# Patient Record
Sex: Female | Born: 1955 | Race: White | Hispanic: No | Marital: Married | Smoking: Never smoker
Health system: Southern US, Community
[De-identification: ages and names within clinical notes are randomized; demographics above are authoritative.]

## PROBLEM LIST (undated history)

## (undated) DIAGNOSIS — R112 Nausea with vomiting, unspecified: Secondary | ICD-10-CM

## (undated) DIAGNOSIS — G4700 Insomnia, unspecified: Secondary | ICD-10-CM

## (undated) DIAGNOSIS — Z9889 Other specified postprocedural states: Secondary | ICD-10-CM

## (undated) HISTORY — PX: COLONOSCOPY: SHX174

---
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
## 1998-07-23 ENCOUNTER — Encounter: Payer: Self-pay | Admitting: Gynecology

## 1998-07-23 ENCOUNTER — Ambulatory Visit: Admission: RE | Admit: 1998-07-23 | Discharge: 1998-07-23 | Payer: Self-pay | Admitting: Gynecology

## 1999-06-07 ENCOUNTER — Ambulatory Visit (HOSPITAL_COMMUNITY): Admission: RE | Admit: 1999-06-07 | Discharge: 1999-06-07 | Payer: Self-pay | Admitting: Gynecology

## 1999-06-07 ENCOUNTER — Encounter: Payer: Self-pay | Admitting: Gynecology

## 1999-10-29 ENCOUNTER — Other Ambulatory Visit: Admission: RE | Admit: 1999-10-29 | Discharge: 1999-10-29 | Payer: Self-pay | Admitting: Gynecology

## 2000-08-22 ENCOUNTER — Ambulatory Visit (HOSPITAL_COMMUNITY): Admission: RE | Admit: 2000-08-22 | Discharge: 2000-08-22 | Payer: Self-pay | Admitting: Gynecology

## 2000-08-22 ENCOUNTER — Encounter: Payer: Self-pay | Admitting: Gynecology

## 2000-11-27 ENCOUNTER — Other Ambulatory Visit: Admission: RE | Admit: 2000-11-27 | Discharge: 2000-11-27 | Payer: Self-pay | Admitting: Gynecology

## 2001-12-18 ENCOUNTER — Ambulatory Visit (HOSPITAL_COMMUNITY): Admission: RE | Admit: 2001-12-18 | Discharge: 2001-12-18 | Payer: Self-pay | Admitting: Gynecology

## 2001-12-18 ENCOUNTER — Encounter: Payer: Self-pay | Admitting: Gynecology

## 2002-02-19 ENCOUNTER — Other Ambulatory Visit: Admission: RE | Admit: 2002-02-19 | Discharge: 2002-02-19 | Payer: Self-pay | Admitting: Gynecology

## 2003-03-31 ENCOUNTER — Other Ambulatory Visit: Admission: RE | Admit: 2003-03-31 | Discharge: 2003-03-31 | Payer: Self-pay | Admitting: Gynecology

## 2004-11-04 ENCOUNTER — Ambulatory Visit (HOSPITAL_COMMUNITY): Admission: RE | Admit: 2004-11-04 | Discharge: 2004-11-04 | Payer: Self-pay | Admitting: Gynecology

## 2005-03-23 ENCOUNTER — Other Ambulatory Visit: Admission: RE | Admit: 2005-03-23 | Discharge: 2005-03-23 | Payer: Self-pay | Admitting: Gynecology

## 2006-06-05 ENCOUNTER — Ambulatory Visit (HOSPITAL_COMMUNITY): Admission: RE | Admit: 2006-06-05 | Discharge: 2006-06-05 | Payer: Self-pay | Admitting: Gynecology

## 2006-06-15 ENCOUNTER — Other Ambulatory Visit: Admission: RE | Admit: 2006-06-15 | Discharge: 2006-06-15 | Payer: Self-pay | Admitting: Gynecology

## 2007-06-07 ENCOUNTER — Ambulatory Visit (HOSPITAL_COMMUNITY): Admission: RE | Admit: 2007-06-07 | Discharge: 2007-06-07 | Payer: Self-pay | Admitting: Gynecology

## 2007-06-13 ENCOUNTER — Encounter: Admission: RE | Admit: 2007-06-13 | Discharge: 2007-06-13 | Payer: Self-pay | Admitting: Gynecology

## 2007-07-03 ENCOUNTER — Other Ambulatory Visit: Admission: RE | Admit: 2007-07-03 | Discharge: 2007-07-03 | Payer: Self-pay | Admitting: Gynecology

## 2008-06-13 ENCOUNTER — Ambulatory Visit (HOSPITAL_COMMUNITY): Admission: RE | Admit: 2008-06-13 | Discharge: 2008-06-13 | Payer: Self-pay | Admitting: Gynecology

## 2008-07-03 ENCOUNTER — Encounter: Payer: Self-pay | Admitting: Women's Health

## 2008-07-03 ENCOUNTER — Other Ambulatory Visit: Admission: RE | Admit: 2008-07-03 | Discharge: 2008-07-03 | Payer: Self-pay | Admitting: Gynecology

## 2008-07-03 ENCOUNTER — Ambulatory Visit: Payer: Self-pay | Admitting: Women's Health

## 2010-04-23 ENCOUNTER — Ambulatory Visit (HOSPITAL_COMMUNITY): Admission: RE | Admit: 2010-04-23 | Discharge: 2010-04-23 | Payer: Self-pay | Admitting: Gynecology

## 2010-04-30 ENCOUNTER — Encounter: Admission: RE | Admit: 2010-04-30 | Discharge: 2010-04-30 | Payer: Self-pay | Admitting: Gynecology

## 2010-10-03 ENCOUNTER — Encounter: Payer: Self-pay | Admitting: Gynecology

## 2011-04-13 ENCOUNTER — Other Ambulatory Visit: Payer: Self-pay | Admitting: Gynecology

## 2011-04-13 DIAGNOSIS — Z1231 Encounter for screening mammogram for malignant neoplasm of breast: Secondary | ICD-10-CM

## 2011-04-26 ENCOUNTER — Ambulatory Visit (HOSPITAL_COMMUNITY): Payer: Self-pay

## 2011-04-27 ENCOUNTER — Encounter: Payer: Self-pay | Admitting: Gynecology

## 2011-10-26 ENCOUNTER — Ambulatory Visit: Payer: 59 | Attending: Orthopedic Surgery | Admitting: Physical Therapy

## 2011-10-26 DIAGNOSIS — IMO0001 Reserved for inherently not codable concepts without codable children: Secondary | ICD-10-CM | POA: Insufficient documentation

## 2011-10-26 DIAGNOSIS — M25619 Stiffness of unspecified shoulder, not elsewhere classified: Secondary | ICD-10-CM | POA: Insufficient documentation

## 2011-10-26 DIAGNOSIS — M7500 Adhesive capsulitis of unspecified shoulder: Secondary | ICD-10-CM | POA: Insufficient documentation

## 2011-10-31 ENCOUNTER — Ambulatory Visit: Payer: 59 | Admitting: Rehabilitative and Restorative Service Providers"

## 2011-11-07 ENCOUNTER — Ambulatory Visit: Payer: 59 | Admitting: Rehabilitative and Restorative Service Providers"

## 2011-11-09 ENCOUNTER — Ambulatory Visit: Payer: 59 | Admitting: Physical Therapy

## 2011-11-14 ENCOUNTER — Ambulatory Visit: Payer: 59 | Attending: Orthopedic Surgery | Admitting: Rehabilitative and Restorative Service Providers"

## 2011-11-14 DIAGNOSIS — IMO0001 Reserved for inherently not codable concepts without codable children: Secondary | ICD-10-CM | POA: Insufficient documentation

## 2011-11-14 DIAGNOSIS — M25619 Stiffness of unspecified shoulder, not elsewhere classified: Secondary | ICD-10-CM | POA: Insufficient documentation

## 2011-11-17 ENCOUNTER — Ambulatory Visit: Payer: 59 | Admitting: Rehabilitative and Restorative Service Providers"

## 2011-11-21 ENCOUNTER — Ambulatory Visit: Payer: 59 | Admitting: Rehabilitation

## 2011-11-24 ENCOUNTER — Ambulatory Visit: Payer: 59 | Admitting: Rehabilitative and Restorative Service Providers"

## 2011-11-28 ENCOUNTER — Ambulatory Visit: Payer: 59 | Admitting: Rehabilitative and Restorative Service Providers"

## 2011-12-01 ENCOUNTER — Encounter: Payer: Commercial Managed Care - PPO | Admitting: Rehabilitative and Restorative Service Providers"

## 2011-12-13 ENCOUNTER — Encounter: Payer: Commercial Managed Care - PPO | Admitting: Rehabilitative and Restorative Service Providers"

## 2011-12-15 ENCOUNTER — Encounter: Payer: Commercial Managed Care - PPO | Admitting: Rehabilitative and Restorative Service Providers"

## 2011-12-16 ENCOUNTER — Ambulatory Visit (HOSPITAL_COMMUNITY)
Admission: RE | Admit: 2011-12-16 | Discharge: 2011-12-16 | Disposition: A | Discharge status: Home / Self Care | Payer: 59 | Source: Ambulatory Visit | Attending: Gynecology | Admitting: Gynecology

## 2011-12-16 DIAGNOSIS — Z1231 Encounter for screening mammogram for malignant neoplasm of breast: Secondary | ICD-10-CM | POA: Insufficient documentation

## 2011-12-19 ENCOUNTER — Encounter: Payer: Self-pay | Admitting: Women's Health

## 2011-12-19 ENCOUNTER — Other Ambulatory Visit (HOSPITAL_COMMUNITY)
Admission: RE | Admit: 2011-12-19 | Discharge: 2011-12-19 | Disposition: A | Discharge status: Home / Self Care | Payer: 59 | Source: Ambulatory Visit | Attending: Obstetrics and Gynecology | Admitting: Obstetrics and Gynecology

## 2011-12-19 ENCOUNTER — Ambulatory Visit (INDEPENDENT_AMBULATORY_CARE_PROVIDER_SITE_OTHER): Payer: 59 | Admitting: Women's Health

## 2011-12-19 VITALS — BP 94/60 | Ht 63.0 in | Wt 146.0 lb

## 2011-12-19 DIAGNOSIS — Z01419 Encounter for gynecological examination (general) (routine) without abnormal findings: Secondary | ICD-10-CM | POA: Insufficient documentation

## 2011-12-19 NOTE — Progress Notes (Signed)
Teresa Sharp November 16, 1955 [id number]    History:    The patient presents for annual exam.  Monthly 5 days cycles/vasectomy with no complaints. States had labs at work at [employer] with normal blood sugar and cholesterol. History of normal Paps, last one in 2009. Normal mammogram August 2011 and April 2013. Has not had a colonoscopy.   Past medical history, past surgical history, family history and social history were all reviewed and documented in the EPIC chart. 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 2 sons ages 54 and16, doing well.  ROS:  A  ROS was performed and pertinent positives and negatives are included in the history.  Exam:  Filed Vitals:   12/19/11 0810  BP: 94/60    General appearance:  Normal Head/Neck:  Normal, without cervical or supraclavicular adenopathy. Thyroid:  Symmetrical, normal in size, without palpable masses or nodularity. Respiratory  Effort:  Normal  Auscultation:  Clear without wheezing or rhonchi Cardiovascular  Auscultation:  Regular rate, without rubs, murmurs or gallops  Edema/varicosities:  Not grossly evident Abdominal  Soft,nontender, without masses, guarding or rebound.  Liver/spleen:  No organomegaly noted  Hernia:  None appreciated  Skin  Inspection:  Grossly normal  Palpation:  Grossly normal Neurologic/psychiatric  Orientation:  Normal with appropriate conversation.  Mood/affect:  Normal  Genitourinary    Breasts: Examined lying and sitting.     Right: Without masses, retractions, discharge or axillary adenopathy.     Left: Without masses, retractions, discharge or axillary adenopathy.   Inguinal/mons:  Normal without inguinal adenopathy  External genitalia:  Normal  BUS/Urethra/Skene's glands:  Normal  Bladder:  Normal  Vagina:  Normal  Cervix:  Normal/stenotic  Uterus: anteverted, normal in size, shape and contour.  Midline and mobile  Adnexa/parametria:     Rt: Without masses or tenderness.   Lt: Without masses or  tenderness.  Anus and perineum: Normal  Digital rectal exam: Normal sphincter tone without palpated masses or tenderness  Assessment/Plan:  56 y.o. MWF G3 P2 for annual exam with no complaints.  Normal GYN exam  Plan: Menopause reviewed, asymptomatic at this time. SBE's, annual mammogram, increase regular exercise, calcium rich diet, vitamin D 2000 daily encouraged. Reviewed importance of a screening colonoscopy, instructed to call [facility]. States husband has had colonoscopy and is planning to schedule. UA and Pap only.     Harrington Challenger [facility], 9:28 AM 12/19/2011

## 2011-12-19 NOTE — Patient Instructions (Signed)
Colonoscopy  SCHEDULE     Health Recommendations for Postmenopausal Women Based on the Results of the Women's Health Initiative [facility]) and Other Studies The WHI is a major 15-year research program to address the most common causes of death, disability and poor quality of life in postmenopausal women. Some of these causes are heart disease, cancer, bone loss (osteoporosis) and others. Taking into account all of the findings from [facility] and other studies, here are bottom-line health recommendations for women: CARDIOVASCULAR DISEASE Heart Disease: A heart attack is a medical emergency. Know the signs and symptoms of a heart attack. Hormone therapy should not be used to prevent heart disease. In women with heart disease, hormone therapy should not be used to prevent further disease. Hormone therapy increases the risk of blood clots. Below are things women can do to reduce their risk for heart disease.   Do not smoke. If you smoke, quit. Women who smoke are 2 to 6 times more likely to suffer a heart attack than non-smoking women.   Aim for a healthy weight. Being overweight causes many preventable deaths. Eat a healthy and balanced diet and drink an adequate amount of liquids.   Get moving. Make a commitment to be more physically active. Aim for 30 minutes of activity on most, if not all days of the week.   Eat for heart health. Choose a diet that is low in saturated fat, trans fat, and cholesterol. Include whole grains, vegetables, and fruits. Read the labels on the food container before buying it.   Know your numbers. Ask your caregiver to check your blood pressure, cholesterol (total, HDL, LDL, triglycerides) and blood glucose. Work with your caregiver to improve any numbers that are not normal.   High blood pressure. Limit or stop your table salt intake (try salt substitute and food seasonings), avoid salty foods and drinks. Read the labels on the food container before buying it. Avoid becoming  overweight by eating well and exercising.  STROKE  Stroke is a medical emergency. Stroke can be the result of a blood clot in the blood vessel in the brain or by a brain hemorrhage (bleeding). Know the signs and symptoms of a stroke. To lower the risk of developing a stroke:  Avoid fatty foods.   Quit smoking.   Control your diabetes, blood pressure, and irregular heart rate.  THROMBOPHLIBITIS (BLOOD CLOT) OF THE LEG  Hormone treatment is a big cause of developing blood clots in the leg. Becoming overweight and leading a stationary lifestyle also may contribute to developing blood clots. Controlling your diet and exercising will help lower the risk of developing blood clots. CANCER SCREENING  Breast Cancer: Women should take steps to reduce their risk of breast cancer. This includes having regular mammograms, monthly self breast exams and regular breast exams by your caregiver. Have a mammogram every one to two years if you are 51 to 54 years old. Have a mammogram annually if you are 55 years old or older depending on your risk factors. Women who are high risk for breast cancer may need more frequent mammograms. There are tests available (testing the genes in your body) if you have family history of breast cancer called BRCA 1 and 2. These tests can help determine the risks of developing breast cancer.   Intestinal or Stomach Cancer: Women should talk to their caregiver about when to start screening, what tests and how often they should be done, and the benefits and risks of doing these tests. Tests  to consider are a rectal exam, fecal occult blood, sigmoidoscopy, colononoscoby, barium enema and upper GI series of the stomach. Depending on the age, you may want to get a medical and family history of colon cancer. Women who are high risk may need to be screened at an earlier age and more often.   Cervical Cancer: A Pap test of the cervix should be done every year and every 3 years when there has been  three straight years of a normal Pap test. Women with an abnormal Pap test should be screened more often or have a cervical biopsy depending on your caregiver's recommendation.   Uterine Cancer: If you have vaginal bleeding after you are in the menopause, it should be evaluated by your caregiver.   Ovarian cancer: There are no reliable tests available to screen for ovarian cancer at this time except for yearly pelvic exams.   Lung Cancer: Yearly chest X-rays can detect lung cancer and should be done on high risk women, such as cigarette smokers and women with chronic lung disease (emphysemia).   Skin Cancer: A complete body skin exam should be done at your yearly examination. Avoid overexposure to the sun and ultraviolet light lamps. Use a strong sun block cream when in the sun. All of these things are important in lowering the risk of skin cancer.  MENOPAUSE Menopause Symptoms: Hormone therapy products are effective for treating symptoms associated with menopause:  Moderate to severe hot flashes.   Night sweats.   Mood swings.   Headaches.   Tiredness.   Loss of sex drive.   Insomnia.   Other symptoms.  However, hormone therapy products carry serious risks, especially in older women. Women who use or are thinking about using estrogen or estrogen with progestin treatments should discuss that with their caregiver. Your caregiver will know if the benefits outweigh the risks. The Food and Drug Administration (FDA) has concluded that hormone therapy should be used only at the lowest doses and for the shortest amount of time to reach treatment goals. It is not known at what doses there may be less risk of serious side effects. There are other treatments such as herbal medication (not controlled or regulated by the FDA), group therapy, counseling and acupuncture that may be helpful. OSTEOPOROSIS Protecting Against Bone Loss and Preventing Fracture: If hormone therapy is used for prevention of  bone loss (osteoporosis), the risks for bone loss must outweigh the risk of the therapy. Women considering taking hormone therapy for bone loss should ask their health care providers about other medications (fosamax and boniva) that are considered safe and effective for preventing bone loss and bone fractures. To guard against bone loss or fractures, it is recommended that women should take at least 1000-1500 mg of calcium and 400-800 IU of vitamin D daily in divided doses. Smoking and excessive alcohol intake increases the risk of osteoporosis. Eat foods rich in calcium and vitamin D and do weight bearing exercises several times a week as your caregiver suggests. DIABETES Diabetes Melitus: Women with Type I or Type 2 diabetes should keep their diabetes in control with diet, exercise and medication. Avoid too many sweets, starchy and fatty foods. Being overweight can affect your diabetes. COGNITION AND MEMORY Cognition and Memory: Menopausal hormone therapy is not recommended for the prevention of cognitive disorders such as Alzheimer's disease or memory loss. WHI found that women treated with hormone therapy have a greater risk of developing dementia.  DEPRESSION  Depression may occur at any  age, but is common in elderly women. The reasons may be because of physical, medical, social (loneliness), financial and/or economic problems and needs. Becoming involved with church, volunteer or social groups, seeking treatment for any physical or medical problems is recommended. Also, look into getting professional advice for any economic or financial problems. ACCIDENTS  Accidents are common and can be serious in the elderly woman. Prepare your house to prevent accidents. Eliminate throw rugs, use hip protectors, place hand bars in the bath, shower and toilet areas. Avoid wearing high heel shoes and walking on wet, snowy and icy areas. Stop driving if you have vision, hearing problems or are unsteady with you  movements and reflexes. RHEUMATOID ARTHRITIS Rheumatoid arthritis causes pain, swelling and stiffness of your bone joints. It can limit many of your activities. Over-the-counter medications may help, but prescription medications may be necessary. Talk with your caregiver about this. Exercise (walking, water aerobics), good posture, using splints on painful joints, warm baths or applying warm compresses to stiff joints and cold compresses to painful joints may be helpful. Smoking and excessive drinking may worsen the symptoms of arthritis. Seek help from a physical therapist if the arthritis is becoming a problem with your daily activities. IMMUNIZATIONS  Several immunizations are important to have during your senior years, including:   Tetanus and a diptheria shot booster every 10 years.   Influenza every year before the flu season begins.   Pneumonia vaccine.   Shingles vaccine.   Others as indicated (example: H1N1 vaccine).  Document Released: 10/21/2005 Document Revised: 08/18/2011 Document Reviewed: 06/16/2008 [facility] Patient Information 2012 [place], [place].

## 2012-03-29 ENCOUNTER — Encounter: Payer: Self-pay | Admitting: Gastroenterology

## 2012-04-23 ENCOUNTER — Telehealth: Payer: Self-pay | Admitting: Gastroenterology

## 2012-04-24 NOTE — Telephone Encounter (Signed)
Pt was moved to 945 am she has been notified

## 2012-04-24 NOTE — Telephone Encounter (Signed)
Pt can move to 3 pm same day Left message on machine to call back

## 2012-04-25 ENCOUNTER — Encounter: Payer: Self-pay | Admitting: Gastroenterology

## 2012-04-25 ENCOUNTER — Ambulatory Visit (INDEPENDENT_AMBULATORY_CARE_PROVIDER_SITE_OTHER): Payer: 59 | Admitting: Gastroenterology

## 2012-04-25 VITALS — BP 100/54 | HR 68 | Ht 62.0 in | Wt 147.0 lb

## 2012-04-25 DIAGNOSIS — Z1211 Encounter for screening for malignant neoplasm of colon: Secondary | ICD-10-CM

## 2012-04-25 MED ORDER — MOVIPREP 100 G PO SOLR: 1.0000 | ORAL | Status: DC

## 2012-04-25 NOTE — Patient Instructions (Addendum)
You will be set up for a colonoscopy at [facility] with MAC sedation for screening.

## 2012-04-25 NOTE — Progress Notes (Signed)
  HPI: This is a  very pleasant 56 year old woman whom I am meeting for the first time today.   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 No GI symptoms.  No bleeding.  Not concerned about her bowels.  Slowly gaining weight.  No FH of colon cancer  She is here to discuss colonoscopy for colon cancer screening     Review of systems: Pertinent positive and negative review of systems were noted in the above HPI section. Complete review of systems was performed and was otherwise normal.    History reviewed. No pertinent past medical history.  Past Surgical History  Procedure Date  . Tonsillectomy and adenoidectomy   . Cesarean section     Current Outpatient Prescriptions  Medication Sig Dispense Refill  . MOVIPREP 100 G SOLR Take 1 kit (100 g total) by mouth as directed. Name brand only  1 kit  0    Allergies as of 04/25/2012  . (No Known Allergies)    Family History  Problem Relation Age of Onset  . Diabetes Mother   . Hypertension Father   . Heart disease Father   . Diabetes Maternal Grandmother   . Heart disease Maternal Grandfather     History   Social History  . Marital Status: Married    Spouse Name: N/A    Number of Children: N/A  . Years of Education: N/A   Occupational History  . Not on file.   Social History Main Topics  . Smoking status: Never Smoker   . Smokeless tobacco: Never Used  . Alcohol Use: Yes     OCCASIONAL ONLY  . Drug Use: No  . Sexually Active: Yes     PARTNER PERMANENT STERILIZATION   Other Topics Concern  . Not on file   Social History Narrative  . No narrative on file       Physical Exam: BP 100/54  Pulse 68  Ht 5\' 2"  (1.575 m)  Wt 147 lb (66.679 kg)  BMI 26.89 kg/m2 Constitutional: generally well-appearing Psychiatric: alert and oriented x3 Eyes: extraocular movements intact Mouth: oral pharynx moist, no lesions Neck: supple no lymphadenopathy Cardiovascular: heart regular rate and rhythm Lungs:  clear to auscultation bilaterally Abdomen: soft, nontender, nondistended, no obvious ascites, no peritoneal signs, normal bowel sounds Extremities: no lower extremity edema bilaterally Skin: no lesions on visible extremities    Assessment and plan: 56 y.o. female with  routine risk for colon cancer  She prefers deeper sedation with MAC and I'm happy to provide that. She is at routine risk for colon cancer. We discussed risks, benefits to colonoscopy and she agreed to proceed.

## 2012-06-05 ENCOUNTER — Encounter: Payer: Self-pay | Admitting: Gastroenterology

## 2012-06-05 ENCOUNTER — Ambulatory Visit (AMBULATORY_SURGERY_CENTER): Payer: 59 | Admitting: Gastroenterology

## 2012-06-05 VITALS — BP 102/71 | HR 74 | Temp 98.4°F | Resp 18 | Ht 62.0 in | Wt 147.0 lb

## 2012-06-05 DIAGNOSIS — Z1211 Encounter for screening for malignant neoplasm of colon: Secondary | ICD-10-CM

## 2012-06-05 DIAGNOSIS — K648 Other hemorrhoids: Secondary | ICD-10-CM

## 2012-06-05 MED ORDER — SODIUM CHLORIDE 0.9 % IV SOLN: 500.0000 mL | INTRAVENOUS | Status: DC

## 2012-06-05 NOTE — Op Note (Signed)
[facility] [address] [place], [address]   COLONOSCOPY PROCEDURE REPORT  PATIENT: Teresa Sharp, Teresa Sharp  MR#: [id number] BIRTHDATE: [birth year] , 56  yrs. old GENDER: Female ENDOSCOPIST: Rachael Fee, MD PROCEDURE DATE:  06/05/2012 PROCEDURE:   Colonoscopy, diagnostic ASA CLASS:   Class II INDICATIONS:  average risk screening. MEDICATIONS: propofol (Diprivan) 200mg  IV  DESCRIPTION OF PROCEDURE:   After the risks benefits and alternatives of the procedure were thoroughly explained, informed consent was obtained.  A digital rectal exam revealed no abnormalities of the rectum.   The LB [id number]-[id number]  endoscope was introduced through the anus and advanced to the cecum, which was identified by both the appendix and ileocecal valve. No adverse events experienced.   The quality of the prep was good.  The instrument was then slowly withdrawn as the colon was fully examined.    COLON FINDINGS: Small internal hemorrhoids were found.   The colon mucosa was otherwise normal.  Retroflexed views revealed no abnormalities. The time to cecum=3 minutes 59 seconds.  Withdrawal time=7 minutes 48 seconds.  The scope was withdrawn and the procedure completed. COMPLICATIONS: There were no complications.  ENDOSCOPIC IMPRESSION: 1.   Small internal hemorrhoids 2.   The colon mucosa was otherwise normal   RECOMMENDATIONS: You should continue to follow colorectal cancer screening guidelines for "routine risk" patients with a repeat colonoscopy in 10 years. There is no need for FOBT (stool) testing for at least 5 years.   eSigned:  Rachael Fee, MD 06/05/2012 10:09 AM

## 2012-06-05 NOTE — Progress Notes (Signed)
No complaints noted in the recovery room. Maw  Patient did not have preoperative order for IV antibiotic SSI prophylaxis. (G8918) Patient did not experience any of the following events: a burn prior to discharge; a fall within the facility; wrong site/side/patient/procedure/implant event; or a hospital transfer or hospital admission upon discharge from the facility. (G8907)  

## 2012-06-05 NOTE — Patient Instructions (Addendum)
Handouts were given to your care partner on hemorrhoids and high fiber diet.  You may continue your current medications today.  Please call if any questions or concerns.    YOU HAD AN ENDOSCOPIC PROCEDURE TODAY AT THE [facility]: Refer to the procedure report that was given to you for any specific questions about what was found during the examination.  If the procedure report does not answer your questions, please call your gastroenterologist to clarify.  If you requested that your care partner not be given the details of your procedure findings, then the procedure report has been included in a sealed envelope for you to review at your convenience later.  YOU SHOULD EXPECT: Some feelings of bloating in the abdomen. Passage of more gas than usual.  Walking can help get rid of the air that was put into your GI tract during the procedure and reduce the bloating. If you had a lower endoscopy (such as a colonoscopy or flexible sigmoidoscopy) you may notice spotting of blood in your stool or on the toilet paper. If you underwent a bowel prep for your procedure, then you may not have a normal bowel movement for a few days.  DIET: Your first meal following the procedure should be a light meal and then it is ok to progress to your normal diet.  A half-sandwich or bowl of soup is an example of a good first meal.  Heavy or fried foods are harder to digest and may make you feel nauseous or bloated.  Likewise meals heavy in dairy and vegetables can cause extra gas to form and this can also increase the bloating.  Drink plenty of fluids but you should avoid alcoholic beverages for 24 hours.  ACTIVITY: Your care partner should take you home directly after the procedure.  You should plan to take it easy, moving slowly for the rest of the day.  You can resume normal activity the day after the procedure however you should NOT DRIVE or use heavy machinery for 24 hours (because of the sedation medicines used  during the test).    SYMPTOMS TO REPORT IMMEDIATELY: A gastroenterologist can be reached at any hour.  During normal business hours, 8:30 AM to 5:00 PM Monday through Friday, call [phone].  After hours and on weekends, please call the GI answering service at [phone] who will take a message and have the physician on call contact you.   Following lower endoscopy (colonoscopy or flexible sigmoidoscopy):  Excessive amounts of blood in the stool  Significant tenderness or worsening of abdominal pains  Swelling of the abdomen that is new, acute  Fever of 100F or higher    FOLLOW UP: If any biopsies were taken you will be contacted by phone or by letter within the next 1-3 weeks.  Call your gastroenterologist if you have not heard about the biopsies in 3 weeks.  Our staff will call the home number listed on your records the next business day following your procedure to check on you and address any questions or concerns that you may have at that time regarding the information given to you following your procedure. This is a courtesy call and so if there is no answer at the home number and we have not heard from you through the emergency physician on call, we will assume that you have returned to your regular daily activities without incident.  SIGNATURES/CONFIDENTIALITY: You and/or your care partner have signed paperwork which will be entered into your  electronic medical record.  These signatures attest to the fact that that the information above on your After Visit Summary has been reviewed and is understood.  Full responsibility of the confidentiality of this discharge information lies with you and/or your care-partner.

## 2012-06-06 ENCOUNTER — Telehealth: Payer: Self-pay

## 2012-06-06 NOTE — Telephone Encounter (Signed)
  Follow up Call-  Call back number 06/05/2012  Post procedure Call Back phone  # [phone]  Permission to leave phone message Yes     Patient questions:  Do you have a fever, pain , or abdominal swelling? no Pain Score  0 *  Have you tolerated food without any problems? yes  Have you been able to return to your normal activities? yes  Do you have any questions about your discharge instructions: Diet   no Medications  no Follow up visit  no  Do you have questions or concerns about your Care? no  Actions: * If pain score is 4 or above: No action needed, pain <4.

## 2013-01-29 ENCOUNTER — Other Ambulatory Visit (HOSPITAL_COMMUNITY): Payer: Self-pay | Admitting: Neurosurgery

## 2013-01-29 DIAGNOSIS — M542 Cervicalgia: Secondary | ICD-10-CM

## 2013-01-29 DIAGNOSIS — M5410 Radiculopathy, site unspecified: Secondary | ICD-10-CM

## 2013-02-01 ENCOUNTER — Ambulatory Visit (HOSPITAL_COMMUNITY)
Admission: RE | Admit: 2013-02-01 | Discharge: 2013-02-01 | Disposition: A | Discharge status: Home / Self Care | Payer: 59 | Source: Ambulatory Visit | Attending: Neurosurgery | Admitting: Neurosurgery

## 2013-02-01 DIAGNOSIS — M542 Cervicalgia: Secondary | ICD-10-CM

## 2013-02-01 DIAGNOSIS — M5020 Other cervical disc displacement, unspecified cervical region: Secondary | ICD-10-CM | POA: Insufficient documentation

## 2013-02-01 DIAGNOSIS — M5410 Radiculopathy, site unspecified: Secondary | ICD-10-CM

## 2013-02-01 DIAGNOSIS — M5412 Radiculopathy, cervical region: Secondary | ICD-10-CM | POA: Insufficient documentation

## 2013-02-06 ENCOUNTER — Other Ambulatory Visit: Payer: Self-pay | Admitting: Neurosurgery

## 2013-02-07 ENCOUNTER — Encounter (HOSPITAL_COMMUNITY): Payer: Self-pay | Admitting: Pharmacy Technician

## 2013-02-15 ENCOUNTER — Encounter (HOSPITAL_COMMUNITY): Payer: Self-pay

## 2013-02-15 ENCOUNTER — Encounter (HOSPITAL_COMMUNITY)
Admission: RE | Admit: 2013-02-15 | Discharge: 2013-02-15 | Disposition: A | Discharge status: Home / Self Care | Payer: 59 | Source: Ambulatory Visit | Attending: Neurosurgery | Admitting: Neurosurgery

## 2013-02-15 HISTORY — DX: Insomnia, unspecified: G47.00

## 2013-02-15 HISTORY — DX: Nausea with vomiting, unspecified: Z98.890

## 2013-02-15 HISTORY — DX: Nausea with vomiting, unspecified: R11.2

## 2013-02-15 LAB — CBC
HCT: 38.5 % (ref 36.0–46.0)
MCH: 28.6 pg (ref 26.0–34.0)
MCHC: 33.8 g/dL (ref 30.0–36.0)
MCV: 84.8 fL (ref 78.0–100.0)
RDW: 13 % (ref 11.5–15.5)
WBC: 8.1 10*3/uL (ref 4.0–10.5)

## 2013-02-15 NOTE — Pre-Procedure Instructions (Signed)
ANATASIA TINO  02/15/2013   Your procedure is scheduled on:  Tues, June 10 @ 11:40 AM  Report to [facility] at 8:45 AM.  Call this number if you have problems the morning of surgery: [phone]   Remember:   Do not eat food or drink liquids after midnight.      Do not wear jewelry, make-up or nail polish.  Do not wear lotions, powders, or perfumes. You may wear deodorant.  Do not shave 48 hours prior to surgery. .  Do not bring valuables to the hospital.  [facility] is not responsible                   for any belongings or valuables.  Contacts, dentures or bridgework may not be worn into surgery.  Leave suitcase in the car. After surgery it may be brought to your room.  For patients admitted to the hospital, checkout time is 11:00 AM the day of  discharge.   Patients discharged the day of surgery will not be allowed to drive  home.    Special Instructions: Shower using CHG 2 nights before surgery and the night before surgery.  If you shower the day of surgery use CHG.  Use special wash - you have one bottle of CHG for all showers.  You should use approximately 1/3 of the bottle for each shower.   Please read over the following fact sheets that you were given: Pain Booklet, Coughing and Deep Breathing, MRSA Information and Surgical Site Infection Prevention

## 2013-02-15 NOTE — Progress Notes (Addendum)
Pt doesn't have a cardiologist   Denies ever having a heart cath/echo/stress test  Pt doesn't have a medical MD  Denies ekg or cxr in past yr

## 2013-02-18 MED ORDER — CEFAZOLIN SODIUM-DEXTROSE 2-3 GM-% IV SOLR
2.0000 g | INTRAVENOUS | Status: AC
  Administered 2013-02-19: 2 g via INTRAVENOUS
  Filled 2013-02-18: qty 50

## 2013-02-19 ENCOUNTER — Encounter (HOSPITAL_COMMUNITY)
Admission: RE | Disposition: A | Discharge status: Home / Self Care | Payer: Self-pay | Source: Ambulatory Visit | Attending: Neurosurgery

## 2013-02-19 ENCOUNTER — Encounter (HOSPITAL_COMMUNITY): Payer: Self-pay | Admitting: Anesthesiology

## 2013-02-19 ENCOUNTER — Ambulatory Visit (HOSPITAL_COMMUNITY)
Admission: RE | Admit: 2013-02-19 | Discharge: 2013-02-19 | Disposition: A | Discharge status: Home / Self Care | Payer: 59 | Source: Ambulatory Visit | Attending: Neurosurgery | Admitting: Neurosurgery

## 2013-02-19 ENCOUNTER — Ambulatory Visit (HOSPITAL_COMMUNITY): Payer: 59 | Admitting: Anesthesiology

## 2013-02-19 ENCOUNTER — Ambulatory Visit (HOSPITAL_COMMUNITY): Payer: 59

## 2013-02-19 ENCOUNTER — Encounter (HOSPITAL_COMMUNITY): Payer: Self-pay | Admitting: *Deleted

## 2013-02-19 DIAGNOSIS — M47812 Spondylosis without myelopathy or radiculopathy, cervical region: Secondary | ICD-10-CM | POA: Insufficient documentation

## 2013-02-19 DIAGNOSIS — M5030 Other cervical disc degeneration, unspecified cervical region: Secondary | ICD-10-CM | POA: Insufficient documentation

## 2013-02-19 DIAGNOSIS — Z01812 Encounter for preprocedural laboratory examination: Secondary | ICD-10-CM | POA: Insufficient documentation

## 2013-02-19 DIAGNOSIS — M5020 Other cervical disc displacement, unspecified cervical region: Secondary | ICD-10-CM | POA: Insufficient documentation

## 2013-02-19 HISTORY — PX: ANTERIOR CERVICAL DECOMP/DISCECTOMY FUSION: SHX1161

## 2013-02-19 SURGERY — ANTERIOR CERVICAL DECOMPRESSION/DISCECTOMY FUSION 1 LEVEL
Anesthesia: General | Site: Neck | Wound class: Clean

## 2013-02-19 MED ORDER — KCL IN DEXTROSE-NACL 20-5-0.45 MEQ/L-%-% IV SOLN
INTRAVENOUS | Status: DC
  Filled 2013-02-19 (×2): qty 1000

## 2013-02-19 MED ORDER — BUPIVACAINE HCL (PF) 0.5 % IJ SOLN
INTRAMUSCULAR | Status: DC | PRN
  Administered 2013-02-19: 5 mL

## 2013-02-19 MED ORDER — THROMBIN 5000 UNITS EX SOLR
CUTANEOUS | Status: DC | PRN
  Administered 2013-02-19 (×2): 5000 [IU] via TOPICAL

## 2013-02-19 MED ORDER — SODIUM CHLORIDE 0.9 % IJ SOLN: 3.0000 mL | INTRAMUSCULAR | Status: DC | PRN

## 2013-02-19 MED ORDER — NEOSTIGMINE METHYLSULFATE 1 MG/ML IJ SOLN
INTRAMUSCULAR | Status: DC | PRN
  Administered 2013-02-19: 3 mg via INTRAVENOUS

## 2013-02-19 MED ORDER — OXYCODONE HCL 5 MG/5ML PO SOLN: 5.0000 mg | Freq: Once | ORAL | Status: DC | PRN

## 2013-02-19 MED ORDER — OXYCODONE-ACETAMINOPHEN 5-325 MG PO TABS: 1.0000 | ORAL_TABLET | ORAL | Status: DC | PRN

## 2013-02-19 MED ORDER — MORPHINE SULFATE 2 MG/ML IJ SOLN: 1.0000 mg | INTRAMUSCULAR | Status: DC | PRN

## 2013-02-19 MED ORDER — HYDROCODONE-ACETAMINOPHEN 5-325 MG PO TABS: 1.0000 | ORAL_TABLET | ORAL | Status: DC | PRN

## 2013-02-19 MED ORDER — CEFAZOLIN SODIUM 1-5 GM-% IV SOLN
1.0000 g | Freq: Three times a day (TID) | INTRAVENOUS | Status: DC
  Filled 2013-02-19 (×2): qty 50

## 2013-02-19 MED ORDER — LIDOCAINE-EPINEPHRINE 1 %-1:100000 IJ SOLN
INTRAMUSCULAR | Status: DC | PRN
  Administered 2013-02-19: 5 mL

## 2013-02-19 MED ORDER — SENNA 8.6 MG PO TABS: 1.0000 | ORAL_TABLET | Freq: Two times a day (BID) | ORAL | Status: DC

## 2013-02-19 MED ORDER — DOCUSATE SODIUM 100 MG PO CAPS: 100.0000 mg | ORAL_CAPSULE | Freq: Two times a day (BID) | ORAL | Status: DC

## 2013-02-19 MED ORDER — DEXAMETHASONE SODIUM PHOSPHATE 10 MG/ML IJ SOLN
INTRAMUSCULAR | Status: DC | PRN
  Administered 2013-02-19: 10 mg via INTRAVENOUS

## 2013-02-19 MED ORDER — PROPOFOL 10 MG/ML IV BOLUS
INTRAVENOUS | Status: DC | PRN
  Administered 2013-02-19: 120 mg via INTRAVENOUS

## 2013-02-19 MED ORDER — 0.9 % SODIUM CHLORIDE (POUR BTL) OPTIME
TOPICAL | Status: DC | PRN
  Administered 2013-02-19: 1000 mL

## 2013-02-19 MED ORDER — SCOPOLAMINE 1 MG/3DAYS TD PT72
MEDICATED_PATCH | TRANSDERMAL | Status: AC
  Administered 2013-02-19: 1 via TRANSDERMAL
  Filled 2013-02-19: qty 1

## 2013-02-19 MED ORDER — GLYCOPYRROLATE 0.2 MG/ML IJ SOLN
INTRAMUSCULAR | Status: DC | PRN
  Administered 2013-02-19: 0.4 mg via INTRAVENOUS

## 2013-02-19 MED ORDER — SODIUM CHLORIDE 0.9 % IJ SOLN
3.0000 mL | Freq: Two times a day (BID) | INTRAMUSCULAR | Status: DC
  Administered 2013-02-19: 3 mL via INTRAVENOUS

## 2013-02-19 MED ORDER — ACETAMINOPHEN 325 MG PO TABS: 650.0000 mg | ORAL_TABLET | ORAL | Status: DC | PRN

## 2013-02-19 MED ORDER — ACETAMINOPHEN 10 MG/ML IV SOLN
INTRAVENOUS | Status: AC
  Administered 2013-02-19: 1000 mg via INTRAVENOUS
  Filled 2013-02-19: qty 100

## 2013-02-19 MED ORDER — PROMETHAZINE HCL 25 MG/ML IJ SOLN: 6.2500 mg | INTRAMUSCULAR | Status: DC | PRN

## 2013-02-19 MED ORDER — HEMOSTATIC AGENTS (NO CHARGE) OPTIME
TOPICAL | Status: DC | PRN
  Administered 2013-02-19: 1 via TOPICAL

## 2013-02-19 MED ORDER — FENTANYL CITRATE 0.05 MG/ML IJ SOLN
INTRAMUSCULAR | Status: DC | PRN
  Administered 2013-02-19: 200 ug via INTRAVENOUS
  Administered 2013-02-19: 50 ug via INTRAVENOUS

## 2013-02-19 MED ORDER — OXYCODONE HCL 5 MG PO TABS: 5.0000 mg | ORAL_TABLET | Freq: Once | ORAL | Status: DC | PRN

## 2013-02-19 MED ORDER — PHENOL 1.4 % MT LIQD: 1.0000 | OROMUCOSAL | Status: DC | PRN

## 2013-02-19 MED ORDER — ACETAMINOPHEN 650 MG RE SUPP: 650.0000 mg | RECTAL | Status: DC | PRN

## 2013-02-19 MED ORDER — MEPERIDINE HCL 25 MG/ML IJ SOLN: 6.2500 mg | INTRAMUSCULAR | Status: DC | PRN

## 2013-02-19 MED ORDER — LIDOCAINE HCL (CARDIAC) 20 MG/ML IV SOLN
INTRAVENOUS | Status: DC | PRN
  Administered 2013-02-19: 50 mg via INTRAVENOUS

## 2013-02-19 MED ORDER — ONDANSETRON HCL 4 MG/2ML IJ SOLN
INTRAMUSCULAR | Status: DC | PRN
  Administered 2013-02-19: 4 mg via INTRAVENOUS

## 2013-02-19 MED ORDER — LIDOCAINE HCL 4 % MT SOLN
OROMUCOSAL | Status: DC | PRN
  Administered 2013-02-19: 4 mL via TOPICAL

## 2013-02-19 MED ORDER — FENTANYL CITRATE 0.05 MG/ML IJ SOLN
25.0000 ug | INTRAMUSCULAR | Status: DC | PRN
  Administered 2013-02-19 (×2): 50 ug via INTRAVENOUS

## 2013-02-19 MED ORDER — EPHEDRINE SULFATE 50 MG/ML IJ SOLN
INTRAMUSCULAR | Status: DC | PRN
  Administered 2013-02-19: 5 mg via INTRAVENOUS
  Administered 2013-02-19: 10 mg via INTRAVENOUS
  Administered 2013-02-19 (×2): 5 mg via INTRAVENOUS

## 2013-02-19 MED ORDER — METHOCARBAMOL 500 MG PO TABS
500.0000 mg | ORAL_TABLET | Freq: Four times a day (QID) | ORAL | Status: DC | PRN
  Administered 2013-02-19: 500 mg via ORAL
  Filled 2013-02-19: qty 1

## 2013-02-19 MED ORDER — ROCURONIUM BROMIDE 100 MG/10ML IV SOLN
INTRAVENOUS | Status: DC | PRN
  Administered 2013-02-19: 50 mg via INTRAVENOUS

## 2013-02-19 MED ORDER — ALUM & MAG HYDROXIDE-SIMETH 200-200-20 MG/5ML PO SUSP: 30.0000 mL | Freq: Four times a day (QID) | ORAL | Status: DC | PRN

## 2013-02-19 MED ORDER — MIDAZOLAM HCL 2 MG/2ML IJ SOLN: 0.5000 mg | Freq: Once | INTRAMUSCULAR | Status: DC | PRN

## 2013-02-19 MED ORDER — LACTATED RINGERS IV SOLN
INTRAVENOUS | Status: DC | PRN
  Administered 2013-02-19 (×2): via INTRAVENOUS

## 2013-02-19 MED ORDER — MENTHOL 3 MG MT LOZG: 1.0000 | LOZENGE | OROMUCOSAL | Status: DC | PRN

## 2013-02-19 MED ORDER — ONDANSETRON HCL 4 MG/2ML IJ SOLN: 4.0000 mg | INTRAMUSCULAR | Status: DC | PRN

## 2013-02-19 MED ORDER — MIDAZOLAM HCL 5 MG/5ML IJ SOLN
INTRAMUSCULAR | Status: DC | PRN
  Administered 2013-02-19: 2 mg via INTRAVENOUS

## 2013-02-19 SURGICAL SUPPLY — 68 items
BAG DECANTER FOR FLEXI CONT (MISCELLANEOUS) ×2 IMPLANT
BANDAGE GAUZE ELAST BULKY 4 IN (GAUZE/BANDAGES/DRESSINGS) ×4 IMPLANT
BENZOIN TINCTURE PRP APPL 2/3 (GAUZE/BANDAGES/DRESSINGS) ×2 IMPLANT
BIT DRILL 2.3 12 FIXED (INSTRUMENTS) ×1 IMPLANT
BIT DRILL NEURO 2X3.1 SFT TUCH (MISCELLANEOUS) ×1 IMPLANT
BLADE ULTRA TIP 2M (BLADE) ×2 IMPLANT
BUR BARREL STRAIGHT FLUTE 4.0 (BURR) ×2 IMPLANT
CANISTER SUCTION 2500CC (MISCELLANEOUS) ×2 IMPLANT
CLOTH BEACON ORANGE TIMEOUT ST (SAFETY) ×2 IMPLANT
CONT SPEC 4OZ CLIKSEAL STRL BL (MISCELLANEOUS) ×4 IMPLANT
COVER MAYO STAND STRL (DRAPES) ×2 IMPLANT
DERMABOND ADVANCED (GAUZE/BANDAGES/DRESSINGS) ×2
DERMABOND ADVANCED .7 DNX12 (GAUZE/BANDAGES/DRESSINGS) ×2 IMPLANT
DRAPE LAPAROTOMY 100X72 PEDS (DRAPES) ×2 IMPLANT
DRAPE MICROSCOPE ZEISS OPMI (DRAPES) ×2 IMPLANT
DRAPE POUCH INSTRU U-SHP 10X18 (DRAPES) ×2 IMPLANT
DRAPE PROXIMA HALF (DRAPES) ×2 IMPLANT
DRESSING TELFA 8X3 (GAUZE/BANDAGES/DRESSINGS) ×2 IMPLANT
DRILL 12MM (INSTRUMENTS) ×2
DRILL NEURO 2X3.1 SOFT TOUCH (MISCELLANEOUS) ×2
DURAPREP 6ML APPLICATOR 50/CS (WOUND CARE) ×2 IMPLANT
ELECT COATED BLADE 2.86 ST (ELECTRODE) ×2 IMPLANT
ELECT REM PT RETURN 9FT ADLT (ELECTROSURGICAL) ×2
ELECTRODE REM PT RTRN 9FT ADLT (ELECTROSURGICAL) ×1 IMPLANT
GAUZE SPONGE 4X4 16PLY XRAY LF (GAUZE/BANDAGES/DRESSINGS) IMPLANT
GLOVE BIO SURGEON STRL SZ8 (GLOVE) ×2 IMPLANT
GLOVE BIOGEL M 8.0 STRL (GLOVE) ×2 IMPLANT
GLOVE BIOGEL PI IND STRL 7.5 (GLOVE) ×2 IMPLANT
GLOVE BIOGEL PI IND STRL 8 (GLOVE) ×3 IMPLANT
GLOVE BIOGEL PI IND STRL 8.5 (GLOVE) ×1 IMPLANT
GLOVE BIOGEL PI INDICATOR 7.5 (GLOVE) ×2
GLOVE BIOGEL PI INDICATOR 8 (GLOVE) ×3
GLOVE BIOGEL PI INDICATOR 8.5 (GLOVE) ×1
GLOVE ECLIPSE 7.5 STRL STRAW (GLOVE) ×8 IMPLANT
GLOVE ECLIPSE 8.0 STRL XLNG CF (GLOVE) ×2 IMPLANT
GLOVE EXAM NITRILE LRG STRL (GLOVE) IMPLANT
GLOVE EXAM NITRILE MD LF STRL (GLOVE) IMPLANT
GLOVE EXAM NITRILE XL STR (GLOVE) IMPLANT
GLOVE EXAM NITRILE XS STR PU (GLOVE) IMPLANT
GOWN BRE IMP SLV AUR LG STRL (GOWN DISPOSABLE) ×2 IMPLANT
GOWN BRE IMP SLV AUR XL STRL (GOWN DISPOSABLE) ×4 IMPLANT
GOWN STRL REIN 2XL LVL4 (GOWN DISPOSABLE) ×4 IMPLANT
HEAD HALTER (SOFTGOODS) ×2 IMPLANT
KIT BASIN OR (CUSTOM PROCEDURE TRAY) ×2 IMPLANT
KIT ROOM TURNOVER OR (KITS) ×2 IMPLANT
NEEDLE HYPO 18GX1.5 BLUNT FILL (NEEDLE) IMPLANT
NEEDLE HYPO 25X1 1.5 SAFETY (NEEDLE) ×2 IMPLANT
NEEDLE SPNL 22GX3.5 QUINCKE BK (NEEDLE) ×4 IMPLANT
NS IRRIG 1000ML POUR BTL (IV SOLUTION) ×2 IMPLANT
PACK LAMINECTOMY NEURO (CUSTOM PROCEDURE TRAY) ×2 IMPLANT
PAD ARMBOARD 7.5X6 YLW CONV (MISCELLANEOUS) ×6 IMPLANT
PIN DISTRACTION 14MM (PIN) ×4 IMPLANT
PLATE 14MM (Plate) ×2 IMPLANT
RUBBERBAND STERILE (MISCELLANEOUS) ×4 IMPLANT
SCREW 12MM (Screw) ×8 IMPLANT
SPACER ASSEM CERV LORD 7M (Spacer) ×2 IMPLANT
SPONGE GAUZE 4X4 12PLY (GAUZE/BANDAGES/DRESSINGS) ×2 IMPLANT
SPONGE INTESTINAL PEANUT (DISPOSABLE) ×2 IMPLANT
SPONGE SURGIFOAM ABS GEL SZ50 (HEMOSTASIS) ×2 IMPLANT
STAPLER SKIN PROX WIDE 3.9 (STAPLE) IMPLANT
STRIP CLOSURE SKIN 1/2X4 (GAUZE/BANDAGES/DRESSINGS) ×2 IMPLANT
SUT VIC AB 3-0 SH 8-18 (SUTURE) ×4 IMPLANT
SYR 20ML ECCENTRIC (SYRINGE) ×2 IMPLANT
SYR 3ML LL SCALE MARK (SYRINGE) IMPLANT
TOWEL OR 17X24 6PK STRL BLUE (TOWEL DISPOSABLE) ×2 IMPLANT
TOWEL OR 17X26 10 PK STRL BLUE (TOWEL DISPOSABLE) ×2 IMPLANT
TRAP SPECIMEN MUCOUS 40CC (MISCELLANEOUS) ×2 IMPLANT
WATER STERILE IRR 1000ML POUR (IV SOLUTION) ×2 IMPLANT

## 2013-02-19 NOTE — Transfer of Care (Signed)
Immediate Anesthesia Transfer of Care Note  Patient: Teresa Sharp  Procedure(s) Performed: Procedure(s) with comments: ANTERIOR CERVICAL DECOMPRESSION/DISCECTOMY FUSION 1 LEVEL C6-7  (N/A) - Cervical six-seven  Anterior cervical decompression and fusion  Patient Location: PACU  Anesthesia Type:General  Level of Consciousness: awake, alert  and oriented  Airway & Oxygen Therapy: Patient Spontanous Breathing and Patient connected to nasal cannula oxygen  Post-op Assessment: Report given to PACU RN  Post vital signs: Reviewed and stable  Complications: No apparent anesthesia complications

## 2013-02-19 NOTE — Interval H&P Note (Signed)
History and Physical Interval Note:  02/19/2013 7:47 AM  Teresa Sharp  has presented today for surgery, with the diagnosis of Cervical degenerative disc disease, Cervical spondylosis, Cervical radiculopathy  The various methods of treatment have been discussed with the patient and family. After consideration of risks, benefits and other options for treatment, the patient has consented to  Procedure(s) with comments: ANTERIOR CERVICAL DECOMPRESSION/DISCECTOMY FUSION 1 LEVEL C6-7  (N/A) - C6-7 Anterior cervical arthroplasty as a surgical intervention .  The patient's history has been reviewed, patient examined, no change in status, stable for surgery.  I have reviewed the patient's chart and labs.  Questions were answered to the patient's satisfaction.     [name]

## 2013-02-19 NOTE — Brief Op Note (Signed)
02/19/2013  1:35 PM  PATIENT:  Teresa Sharp  56 y.o. female  PRE-OPERATIVE DIAGNOSIS:  Cervical disc herniation, cervical degenerative disc disease, Cervical spondylosis, Cervical radiculopathy C 67  POST-OPERATIVE DIAGNOSIS:  Cervical disc herniation, cervical degenerative disc disease, Cervical spondylosis, Cervical radiculopathy C 67  PROCEDURE:  Procedure(s) with comments: ANTERIOR CERVICAL DECOMPRESSION/DISCECTOMY FUSION 1 LEVEL C6-7  (N/A) - Cervical six-seven  Anterior cervical decompression and fusion with allograft, autograft, plate  SURGEON:  Surgeon(s) and Role:    * Keesha Pellum, MD - Primary    * Ernesto M Botero, MD - Assisting  PHYSICIAN ASSISTANT:   ASSISTANTS: Poteat, RN   ANESTHESIA:   general  EBL:  Total I/O In: 1500 [I.V.:1500] Out: 25 [Blood:25]  BLOOD ADMINISTERED:none  DRAINS: none   LOCAL MEDICATIONS USED:  MARCAINE     SPECIMEN:  No Specimen  DISPOSITION OF SPECIMEN:  N/A  COUNTS:  YES  TOURNIQUET:  * No tourniquets in log *  DICTATION: DICTATION: Patient is 56 year old female with cervical disc herniation of C 67 with spondylosis, disc degeneration and radiculopathy.  It was elected to take her to surgery for anterior cervical decompression and fusion C 67 level.  PROCEDURE: Patient was brought to operating room and following the smooth and uncomplicated induction of general endotracheal anesthesia her head was placed on a horseshoe head holder she was placed in 5 pounds of Holter traction and her anterior neck was prepped and draped in usual sterile fashion. An incision was made on the left side of midline after infiltrating the skin and subcutaneous tissues with local lidocaine. The platysmal layer was incised and subplatysmal dissection was performed exposing the anterior border sternocleidomastoid muscle. Using blunt dissection the carotid sheath was kept lateral and trachea and esophagus kept medial exposing the anterior cervical spine. A  bent spinal needle was placed it was felt to be the C 67 level and this was poorly visualized and subsequently confirmed with second radiograph with bent needles at C 56 and C 67 levels.  Longus coli muscles were taken down from the anterior cervical spine using electrocautery and key elevator and self-retaining retractor was placed exposing the C 67 level. The interspace was incised and a thorough discectomy was performed. Distraction pins were placed. Uncinate spurs and central spondylitic ridges were drilled down with a high-speed drill. The spinal cord dura and both C 7 nerve roots were widely decompressed. There was a large amount of herniated disc material compressing the left C 7 nerve root, which was thoroughly decompressed. Hemostasis was assured. After trial sizing a 7 mm lordotic allograft bone wedge was selected and packed with local autograft. This was tamped into position and countersunk appropriately. Distraction weight was removed. A 14 mm trestle luxe anterior cervical plate was affixed to the cervical spine with 12 mm variable-angle screws 2 at C6, 2 at C7. All screws were well-positioned and locking mechanisms were engaged. A final X ray was obtained which only showed the superior aspect of the plate at the correct level without complicating features. Soft tissues were inspected and found to be in good repair. The wound was irrigated. The platysma layer was closed with 3-0 Vicryl stitches and the skin was reapproximated with 3-0 Vicryl subcuticular stitches. The wound was dressed with Dermabond. Counts were correct at the end of the case. Patient was extubated and taken to recovery in stable and satisfactory condition.   PLAN OF CARE: Admit for overnight observation  PATIENT DISPOSITION:  PACU - hemodynamically   stable.   Delay start of Pharmacological VTE agent (>24hrs) due to surgical blood loss or risk of bleeding: yes  

## 2013-02-19 NOTE — Preoperative (Signed)
Beta Blockers   Reason not to administer Beta Blockers:Not Applicable 

## 2013-02-19 NOTE — Progress Notes (Signed)
Doing well.  D/C home this afternoon.

## 2013-02-19 NOTE — Progress Notes (Signed)
Subjective: Patient reports "I feel great!"  Objective: Vital signs in last 24 hours: Temp:  [97.2 F (36.2 C)-98.3 F (36.8 C)] 97.7 F (36.5 C) (06/10 1450) Pulse Rate:  [57-78] 65 (06/10 1450) Resp:  [11-20] 18 (06/10 1450) BP: (91-114)/(38-72) 91/59 mmHg (06/10 1450) SpO2:  [94 %-100 %] 94 % (06/10 1450)  Intake/Output from previous day:   Intake/Output this shift: Total I/O In: 1740 [P.O.:240; I.V.:1500] Out: 25 [Blood:25]  Alert, conversant, smiling. Husband & daughter present. Mild soreness across shoulders. No arm pain. Good strength BUE. Incision with Dermabond. No erythema, swelling, or drainage.   Lab Results: No results found for this basename: WBC, HGB, HCT, PLT,  in the last 72 hours BMET No results found for this basename: NA, K, CL, CO2, GLUCOSE, BUN, CREATININE, CALCIUM,  in the last 72 hours  Studies/Results: Dg Cervical Spine Complete  02/19/2013   *RADIOLOGY REPORT*  Clinical Data: ACDF C6-C7  CERVICAL SPINE - COMPLETE 4+ VIEW  Comparison: Cervical spine MRI - 02/01/2013  Findings:  Four spot lateral intraoperative radiographic images of the cervical spine are provided for review.  Images demonstrate localization and ultimate ACDF of C6 - C7, though note there is suboptimal evaluation of the caudal aspect of the hardware secondary to overlying osseous to soft tissue structures.  Endotracheal and enteric tube tips are excluded from view. Radiopaque material is seen about the operative site.  IMPRESSION: Post C5 - C6 ACDF.   Original Report Authenticated By: Tacey Ruiz, MD    Assessment/Plan: Improved   LOS: 0 days  Per Dr. Venetia Maxon, ok to d/c to home when ambulating. Pt & family verbalizes understanding of d/c instructions & agree to call office tomorrow to schedule 3-4 week f/u with Dr. Venetia Maxon. Rx's called to [facility] at pt request: Hydrocodone 5/325 1/2 -1 tab q4hrs prn pain #40; Robaxin 500mg  1 po q8hrs prn spasm #40.   Georgiann Cocker 02/19/2013, 4:15 PM

## 2013-02-19 NOTE — Op Note (Signed)
02/19/2013  1:35 PM  PATIENT:  Teresa Sharp  57 y.o. female  PRE-OPERATIVE DIAGNOSIS:  Cervical disc herniation, cervical degenerative disc disease, Cervical spondylosis, Cervical radiculopathy C 67  POST-OPERATIVE DIAGNOSIS:  Cervical disc herniation, cervical degenerative disc disease, Cervical spondylosis, Cervical radiculopathy C 67  PROCEDURE:  Procedure(s) with comments: ANTERIOR CERVICAL DECOMPRESSION/DISCECTOMY FUSION 1 LEVEL C6-7  (N/A) - Cervical six-seven  Anterior cervical decompression and fusion with allograft, autograft, plate  SURGEON:  Surgeon(s) and Role:    * Maeola Harman, MD - Primary    * Karn Cassis, MD - Assisting  PHYSICIAN ASSISTANT:   ASSISTANTS: Poteat, RN   ANESTHESIA:   general  EBL:  Total I/O In: 1500 [I.V.:1500] Out: 25 [Blood:25]  BLOOD ADMINISTERED:none  DRAINS: none   LOCAL MEDICATIONS USED:  MARCAINE     SPECIMEN:  No Specimen  DISPOSITION OF SPECIMEN:  N/A  COUNTS:  YES  TOURNIQUET:  * No tourniquets in log *  DICTATION: DICTATION: Patient is 57 year old female with cervical disc herniation of C 67 with spondylosis, disc degeneration and radiculopathy.  It was elected to take her to surgery for anterior cervical decompression and fusion C 67 level.  PROCEDURE: Patient was brought to operating room and following the smooth and uncomplicated induction of general endotracheal anesthesia her head was placed on a horseshoe head holder she was placed in 5 pounds of Holter traction and her anterior neck was prepped and draped in usual sterile fashion. An incision was made on the left side of midline after infiltrating the skin and subcutaneous tissues with local lidocaine. The platysmal layer was incised and subplatysmal dissection was performed exposing the anterior border sternocleidomastoid muscle. Using blunt dissection the carotid sheath was kept lateral and trachea and esophagus kept medial exposing the anterior cervical spine. A  bent spinal needle was placed it was felt to be the C 67 level and this was poorly visualized and subsequently confirmed with second radiograph with bent needles at C 56 and C 67 levels.  Longus coli muscles were taken down from the anterior cervical spine using electrocautery and key elevator and self-retaining retractor was placed exposing the C 67 level. The interspace was incised and a thorough discectomy was performed. Distraction pins were placed. Uncinate spurs and central spondylitic ridges were drilled down with a high-speed drill. The spinal cord dura and both C 7 nerve roots were widely decompressed. There was a large amount of herniated disc material compressing the left C 7 nerve root, which was thoroughly decompressed. Hemostasis was assured. After trial sizing a 7 mm lordotic allograft bone wedge was selected and packed with local autograft. This was tamped into position and countersunk appropriately. Distraction weight was removed. A 14 mm trestle luxe anterior cervical plate was affixed to the cervical spine with 12 mm variable-angle screws 2 at C6, 2 at C7. All screws were well-positioned and locking mechanisms were engaged. A final X ray was obtained which only showed the superior aspect of the plate at the correct level without complicating features. Soft tissues were inspected and found to be in good repair. The wound was irrigated. The platysma layer was closed with 3-0 Vicryl stitches and the skin was reapproximated with 3-0 Vicryl subcuticular stitches. The wound was dressed with Dermabond. Counts were correct at the end of the case. Patient was extubated and taken to recovery in stable and satisfactory condition.   PLAN OF CARE: Admit for overnight observation  PATIENT DISPOSITION:  PACU - hemodynamically  stable.   Delay start of Pharmacological VTE agent (>24hrs) due to surgical blood loss or risk of bleeding: yes

## 2013-02-19 NOTE — Progress Notes (Signed)
Pt. discharged home accompanied by husband. Prescriptions and discharge instructions given with verbalization of understanding. Incision site on neck with no s/s of infection - no swelling, redness, bleeding, and/or drainage noted. Soft collar intact. Opportunity given to ask questions but no question asked. Pt. transported out of this unit in wheelchair by the nurse tech. 

## 2013-02-19 NOTE — Plan of Care (Signed)
Problem: Consults Goal: Diagnosis - Spinal Surgery Outcome: Completed/Met Date Met:  02/19/13 Cervical Spine Fusion

## 2013-02-19 NOTE — Anesthesia Postprocedure Evaluation (Signed)
  Anesthesia Post-op Note  Patient: Teresa Sharp  Procedure(s) Performed: Procedure(s) with comments: ANTERIOR CERVICAL DECOMPRESSION/DISCECTOMY FUSION 1 LEVEL C6-7  (N/A) - Cervical six-seven  Anterior cervical decompression and fusion  Patient Location: PACU  Anesthesia Type:General  Level of Consciousness: awake, alert , oriented and patient cooperative  Airway and Oxygen Therapy: Patient Spontanous Breathing  Post-op Pain: none  Post-op Assessment: Post-op Vital signs reviewed, Patient's Cardiovascular Status Stable, Respiratory Function Stable, Patent Airway, No signs of Nausea or vomiting and Pain level controlled  Post-op Vital Signs: Reviewed and stable  Complications: No apparent anesthesia complications

## 2013-02-19 NOTE — Discharge Summary (Signed)
Physician Discharge Summary  Patient ID: EVELISE REINE MRN: [id number] DOB/AGE: 01/08/1956 57 y.o.  Admit date: 02/19/2013 Discharge date: 02/19/2013  Admission Diagnoses: Cervical disc herniation, cervical degenerative disc disease, Cervical spondylosis, Cervical radiculopathy C 67    Discharge Diagnoses: Cervical disc herniation, cervical degenerative disc disease, Cervical spondylosis, Cervical radiculopathy C 67 s/p ANTERIOR CERVICAL DECOMPRESSION/DISCECTOMY FUSION 1 LEVEL C6-7  (N/A) - Cervical six-seven  Anterior cervical decompression and fusion with allograft, autograft, plate   Active Problems:   * No active hospital problems. *   Discharged Condition: good  Hospital Course: Nyema Hachey was admitted for surgery with Dx cervical disc herniation and radiculopathy C6-7 level.  Following uncomplicated ACDF C6-7, she recovered nicely in Neuro PACU & transferred to 3500 for observation. She has progressed well , with resolution of numbness, tingling, and pain LUE.  Consults: None  Significant Diagnostic Studies: radiology: X-Ray: intra-operative  Treatments: surgery: ANTERIOR CERVICAL DECOMPRESSION/DISCECTOMY FUSION 1 LEVEL C6-7  (N/A) - Cervical six-seven  Anterior cervical decompression and fusion with allograft, autograft, plate    Discharge Exam: Blood pressure 106/61, pulse 66, temperature 97.7 F (36.5 C), temperature source Oral, resp. rate 18, SpO2 95.00%. Alert, conversant, smiling. Husband & daughter present. Mild soreness across shoulders. No arm pain. Good strength BUE. Incision with Dermabond. No erythema, swelling, or drainage.     Disposition: Discharge to home.  Pt & family verbalizes understanding of d/c instructions & agree to call office tomorrow to schedule 3-4 week f/u with Dr. Venetia Maxon. Rx's called to [facility] at pt request: Hydrocodone 5/325 1/2 -1 tab q4hrs prn pain #40; Robaxin 500mg  1 po q8hrs prn spasm #40.       Medication List     As of 02/19/2013  4:22 PM    Notice      You have not been prescribed any medications.          Signed: Georgiann Cocker 02/19/2013, 4:22 PM  Doing well.  D/C home.

## 2013-02-19 NOTE — H&P (Signed)
Patient MRN: [id number] Provider ID: [id number] Date: 02/06/2013 Description: Clinic Note Category: In Office Documents  OUTPATIENT OFFICE NOTE  CHIEF COMPLAINT:   Teresa Sharp returns to review her MRI and cervical radiographs.       DATA:     MRI and cervical radiographs show that she has a significant disk herniation at C6-7 on the left, but this is also a broad-based disk herniation that goes across the entire spinal canal.  There is no cord compression.  There is significant left C7 nerve root compression.  She has significant left arm weakness, which persists, and she remains quite uncomfortable.  She is not able to sleep or get any rest.  She has some mild spondylosis at C5-6 with some left-sided foraminal encroachment but no significant nerve root compression.  IMPRESSION/PLAN:   I have recommended that she undergo surgery, and this would consist of anterior cervical decompression with cervical disk arthroplasty at the C6-7 level.  I reviewed models with her and went over the various treatment options.  She wishes to proceed, and we plan on surgery on 02/19/2013.  Risks and benefits were discussed with the patient.  She underwent nurse teaching per Teresa Sharp. We will fit her for a soft cervical collar.     _________________________________  Teresa Orleans. Venetia Maxon, MD    JOB ID:  [id number] DD:  02/06/2013 DT:  02/06/2013 REVIEWED BY:    Electronically signed by Teresa Sharp  on 02/07/2013 04:04 PM        Patient MRN: [id number] Provider ID: [id number] Date: 01/29/2013 Description: Clinic Note Category: In Office Documents   Radiology Report  Study:   Cervical spine radiographs with flexion/extension views.    Impression:  Demonstrate loss of normal cervical lordosis with multilevel degenerative disc disease at C5-6 and C6-7 levels.  No evidence of spondylolisthesis on flexion/extension.  There is foraminal stenosis, left greater than  right, at C5-6 and to a lesser degree at the C6-7 level.  Odontoid view is normal.       _________________________________  Teresa Orleans. 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       HISTORY OF PRESENT ILLNESS:  She says the fingers in her left hand are numb, and she says it has been going on for the last two months.  She is unable to get any relief.  She describes that she is quite miserable and not able to sleep at night.  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  Dr. Ranell Sharp injected and she had physical therapy, and she says she did well after that.  She says this pain is quite different.  She currently describes that her left second and third digits are numb and have been numb for the last two months.  She denies a lot of pain in her hand, although she does feel that she has pain in her left arm and triceps.    PAST MEDICAL HISTORY:       Current Medical Conditions:  Otherwise unremarkable.  She is  otherwise healthy.      Prior Operations:  She had a tonsillectomy and adenoidectomy in 1962, C-section delivery in 1996.      Medications and Allergies:  She takes no medications and has no known drug allergies.  FAMILY HISTORY:    Mother is in good health at age 62, with diabetes.  Father is in good health, at age 39, with hypertension and coronary artery disease.  SOCIAL HISTORY:    Denies tobacco, alcohol, or drug use.   REVIEW OF SYSTEMS:   Detailed review of systems sheet was reviewed with the  patient.  Under Eyes, she wears reading glasses.  Under Musculoskeletal, she notes left arm pain.    PHYSICAL EXAMINATION:   She is 5 feet, 2 inches tall, 145 pounds.  Teresa Sharp is a pleasant and cooperative woman in no acute distress.  Blood pressure is 108/64, heart rate is 64 and regular, respiratory is 18.     HEENT - normocephalic, atraumatic.      Neck - She does have positive Spurling maneuver to the left with reproducible left-sided neck and arm pain.  She has a minimally positive Tinel sign on the left, negative Phalen sign.  She does not appear to have a suggestion of carpal tunnel syndrome on examination.    Respiratory - chest is clear to auscultation.    Cardiovascular - heart regular rate and rhythm without murmur.    Abdomen - soft, nontender, active bowel sounds.  No hepatosplenomegaly appreciated.    Extremities - without edema, clubbing, or cyanosis.   NEUROLOGICAL EXAMINATION: She is awake, alert, and fully oriented.  She speaks with clear and fluent speech, has intact short- and long-term memory.    Cranial Nerve Examination - pupils are equal, round, and reactive to light.  Extraocular movements are intact.  Facial sensation and facial motor intact and symmetric.  Hearing is intact to finger rub.  Shoulder shrug is symmetric.  Palate is upgoing.  Tongue protrudes in the midline.      Motor Examination - upper extremity strength in full in all motor groups, bilaterally symmetric with the exception of left biceps at 4/5, left triceps at 4+/5, left wrist extensors at 4/5.       Sensory Examination - testing reveals intact sensation to pinprick in her upper extremities bilaterally through both hands.    Deep Tendon Reflexes - deep tendon reflexes are 2 in the biceps, triceps, brachioradialis on the right, 1 on the left biceps, and 2 in the left triceps, 2 at the knees, 2 at the ankles.  Great toes downgoing with plantar stimulation.  Negative Hoffmann signs.     Cerebellar  Examination - normal.  IMPRESSION:     Cervical radiculopathy.  Teresa Sharp has left arm pain and weakness.    PLAN:      I have suggested that she get an MRI of her cervical spine, and I will make further recommendations after that study has been done.     __________________________________ Teresa Orleans. Venetia Maxon, MD   JOB ID:  [id number] DD:  02/07/2013 DT:  02/08/2013 REVIEWED BY: nm  Electronically signed by Teresa Sharp  on 02/11/2013 05:17 PM

## 2013-02-19 NOTE — Anesthesia Preprocedure Evaluation (Addendum)
Anesthesia Evaluation  Patient identified by MRN, date of birth, ID band Patient awake    Reviewed: Allergy & Precautions, H&P , NPO status , Patient's Chart, lab work & pertinent test results  History of Anesthesia Complications (+) PONV  Airway Mallampati: I TM Distance: >3 FB Neck ROM: Full    Dental  (+) Teeth Intact and Dental Advisory Given   Pulmonary neg pulmonary ROS,  breath sounds clear to auscultation  Pulmonary exam normal       Cardiovascular negative cardio ROS  Rhythm:Regular Rate:Normal     Neuro/Psych negative psych ROS   GI/Hepatic negative GI ROS, Neg liver ROS,   Endo/Other  negative endocrine ROS  Renal/GU negative Renal ROS     Musculoskeletal   Abdominal   Peds  Hematology negative hematology ROS (+)   Anesthesia Other Findings   Reproductive/Obstetrics                          Anesthesia Physical Anesthesia Plan  ASA: I  Anesthesia Plan: General   Post-op Pain Management:    Induction: Intravenous  Airway Management Planned: Oral ETT  Additional Equipment:   Intra-op Plan:   Post-operative Plan:   Informed Consent: I have reviewed the patients History and Physical, chart, labs and discussed the procedure including the risks, benefits and alternatives for the proposed anesthesia with the patient or authorized representative who has indicated his/her understanding and acceptance.   Dental advisory given  Plan Discussed with: CRNA and Surgeon  Anesthesia Plan Comments: (Plan routine monitors, GETA )        Anesthesia Quick Evaluation

## 2013-02-19 NOTE — Progress Notes (Signed)
Awake, alert, conversant.  Full strength bilateral biceps, triceps.  MAEW.  Doing well.

## 2013-02-21 ENCOUNTER — Encounter (HOSPITAL_COMMUNITY): Payer: Self-pay | Admitting: Neurosurgery

## 2013-03-11 DIAGNOSIS — M47812 Spondylosis without myelopathy or radiculopathy, cervical region: Secondary | ICD-10-CM | POA: Insufficient documentation

## 2014-02-20 ENCOUNTER — Encounter: Payer: Self-pay | Admitting: Women's Health

## 2014-02-20 ENCOUNTER — Other Ambulatory Visit (HOSPITAL_COMMUNITY)
Admission: RE | Admit: 2014-02-20 | Discharge: 2014-02-20 | Disposition: A | Discharge status: Home / Self Care | Payer: 59 | Source: Ambulatory Visit | Attending: Women's Health | Admitting: Women's Health

## 2014-02-20 ENCOUNTER — Ambulatory Visit (INDEPENDENT_AMBULATORY_CARE_PROVIDER_SITE_OTHER): Payer: 59 | Admitting: Women's Health

## 2014-02-20 VITALS — BP 100/60 | Ht 63.0 in | Wt 141.2 lb

## 2014-02-20 DIAGNOSIS — Z1151 Encounter for screening for human papillomavirus (HPV): Secondary | ICD-10-CM | POA: Insufficient documentation

## 2014-02-20 DIAGNOSIS — E079 Disorder of thyroid, unspecified: Secondary | ICD-10-CM

## 2014-02-20 DIAGNOSIS — Z01419 Encounter for gynecological examination (general) (routine) without abnormal findings: Secondary | ICD-10-CM | POA: Insufficient documentation

## 2014-02-20 DIAGNOSIS — Z13220 Encounter for screening for lipoid disorders: Secondary | ICD-10-CM

## 2014-02-20 LAB — CBC WITH DIFFERENTIAL/PLATELET
BASOS PCT: 0 % (ref 0–1)
Basophils Absolute: 0 10*3/uL (ref 0.0–0.1)
EOS ABS: 0.3 10*3/uL (ref 0.0–0.7)
EOS PCT: 4 % (ref 0–5)
HCT: 37.1 % (ref 36.0–46.0)
HEMOGLOBIN: 12.7 g/dL (ref 12.0–15.0)
Lymphocytes Relative: 34 % (ref 12–46)
Lymphs Abs: 2.8 10*3/uL (ref 0.7–4.0)
MCH: 28.3 pg (ref 26.0–34.0)
MCHC: 34.2 g/dL (ref 30.0–36.0)
MCV: 82.8 fL (ref 78.0–100.0)
MONO ABS: 0.5 10*3/uL (ref 0.1–1.0)
MONOS PCT: 6 % (ref 3–12)
NEUTROS PCT: 56 % (ref 43–77)
Neutro Abs: 4.6 10*3/uL (ref 1.7–7.7)
Platelets: 261 10*3/uL (ref 150–400)
RBC: 4.48 MIL/uL (ref 3.87–5.11)
RDW: 14.2 % (ref 11.5–15.5)
WBC: 8.2 10*3/uL (ref 4.0–10.5)

## 2014-02-20 NOTE — Progress Notes (Signed)
Teresa Sharp Jan 14, 1956 [id number]   History:    Presents for annual exam.   Cycles ended this year - does not recall exactly when. Reports some mild hot flashes and night sweats. Denies vaginal dryness/vasectomy. Colonoscopy 2013- normal. Normal Pap and mammogram history.   Past medical history, past surgical history, family history and social history were all reviewed and documented in the EPIC chart. 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 Mother diabetes , father hypertension and heart disease.  ROS:  A  12 point ROS was performed and pertinent positives and negatives are included.  Exam:  Filed Vitals:   02/20/14 1602  BP: 100/60    General appearance:  Normal Thyroid:  Symmetrical, normal in size, without palpable masses or nodularity. Respiratory  Auscultation:  Clear without wheezing or rhonchi Cardiovascular  Auscultation:  Regular rate, without rubs, murmurs or gallops  Edema/varicosities:  Not grossly evident Abdominal  Soft,nontender, without masses, guarding or rebound.  Liver/spleen:  No organomegaly noted  Hernia:  None appreciated  Skin  Inspection:  Grossly normal   Breasts: Examined lying and sitting.     Right: Without masses, retractions, discharge or axillary adenopathy.     Left: Without masses, retractions, discharge or axillary adenopathy. Gentitourinary   Inguinal/mons:  Normal without inguinal adenopathy  External genitalia:  Normal  BUS/Urethra/Skene's glands:  Normal  Vagina:  Slight vaginal atrophy consistent with postmenopausal state  Cervix:  Normal  Uterus:  anteverted, normal in size, shape and contour.  Midline and mobile  Adnexa/parametria:     Rt: Without masses or tenderness.   Lt: Without masses or tenderness.  Anus and perineum: Normal  Digital rectal exam: Normal sphincter tone without palpated masses or tenderness  Assessment/Plan:  58 y.o. G3P2 for annual exam with no complaints.  Postmenopausal GYN  Exam/No HRT  Plan: Pap with HR HPV, new screening guidelines reviewed. lubricants recommended during intercourse. Instructed to call if menopausal symptoms worsen. HRT discussed declines at this time. Instructed to call if any further bleeding. Health Maintenance: Lipid Panel, UA, CBC, CMP, TSH. SBE's, annual mammogram, overdue instructed to schedule. calcium rich diet and exercise recommended.   Note: This dictation was prepared with Dragon/digital dictation.  Any transcriptional errors that result are unintentional. Huel Cote [facility], 4:34 PM 02/20/2014

## 2014-02-20 NOTE — Patient Instructions (Signed)
Health Recommendations for Postmenopausal Women Respected and ongoing research has looked at the most common causes of death, disability, and poor quality of life in postmenopausal women. The causes include heart disease, diseases of blood vessels, diabetes, depression, cancer, and bone loss (osteoporosis). Many things can be done to help lower the chances of developing these and other common problems: CARDIOVASCULAR DISEASE Heart Disease: A heart attack is a medical emergency. Know the signs and symptoms of a heart attack. Below are things women can do to reduce their risk for heart disease.   Do not smoke. If you smoke, quit.  Aim for a healthy weight. Being overweight causes many preventable deaths. Eat a healthy and balanced diet and drink an adequate amount of liquids.  Get moving. Make a commitment to be more physically active. Aim for 30 minutes of activity on most, if not all days of the week.  Eat for heart health. Choose a diet that is low in saturated fat and cholesterol and eliminate trans fat. Include whole grains, vegetables, and fruits. Read and understand the labels on food containers before buying.  Know your numbers. Ask your caregiver to check your blood pressure, cholesterol (total, HDL, LDL, triglycerides) and blood glucose. Work with your caregiver on improving your entire clinical picture.  High blood pressure. Limit or stop your table salt intake (try salt substitute and food seasonings). Avoid salty foods and drinks. Read labels on food containers before buying. Eating well and exercising can help control high blood pressure. STROKE  Stroke is a medical emergency. Stroke may be the result of a blood clot in a blood vessel in the brain or by a brain hemorrhage (bleeding). Know the signs and symptoms of a stroke. To lower the risk of developing a stroke:  Avoid fatty foods.  Quit smoking.  Control your diabetes, blood pressure, and irregular heart rate. THROMBOPHLEBITIS  (BLOOD CLOT) OF THE LEG  Becoming overweight and leading a stationary lifestyle may also contribute to developing blood clots. Controlling your diet and exercising will help lower the risk of developing blood clots. CANCER SCREENING  Breast Cancer: Take steps to reduce your risk of breast cancer.  You should practice "breast self-awareness." This means understanding the normal appearance and feel of your breasts and should include breast self-examination. Any changes detected, no matter how small, should be reported to your caregiver.  After age 40, you should have a clinical breast exam (CBE) every year.  Starting at age 40, you should consider having a mammogram (breast X-ray) every year.  If you have a family history of breast cancer, talk to your caregiver about genetic screening.  If you are at high risk for breast cancer, talk to your caregiver about having an MRI and a mammogram every year.  Intestinal or Stomach Cancer: Tests to consider are a rectal exam, fecal occult blood, sigmoidoscopy, and colonoscopy. Women who are high risk may need to be screened at an earlier age and more often.  Cervical Cancer:  Beginning at age 30, you should have a Pap test every 3 years as long as the past 3 Pap tests have been normal.  If you have had past treatment for cervical cancer or a condition that could lead to cancer, you need Pap tests and screening for cancer for at least 20 years after your treatment.  If you had a hysterectomy for a problem that was not cancer or a condition that could lead to cancer, then you no longer need Pap tests.    If you are between ages 65 and 70, and you have had normal Pap tests going back 10 years, you no longer need Pap tests.  If Pap tests have been discontinued, risk factors (such as a new sexual partner) need to be reassessed to determine if screening should be resumed.  Some medical problems can increase the chance of getting cervical cancer. In these  cases, your caregiver may recommend more frequent screening and Pap tests.  Uterine Cancer: If you have vaginal bleeding after reaching menopause, you should notify your caregiver.  Ovarian cancer: Other than yearly pelvic exams, there are no reliable tests available to screen for ovarian cancer at this time except for yearly pelvic exams.  Lung Cancer: Yearly chest X-rays can detect lung cancer and should be done on high risk women, such as cigarette smokers and women with chronic lung disease (emphysema).  Skin Cancer: A complete body skin exam should be done at your yearly examination. Avoid overexposure to the sun and ultraviolet light lamps. Use a strong sun block cream when in the sun. All of these things are important in lowering the risk of skin cancer. MENOPAUSE Menopause Symptoms: Hormone therapy products are effective for treating symptoms associated with menopause:  Moderate to severe hot flashes.  Night sweats.  Mood swings.  Headaches.  Tiredness.  Loss of sex drive.  Insomnia.  Other symptoms. Hormone replacement carries certain risks, especially in older women. Women who use or are thinking about using estrogen or estrogen with progestin treatments should discuss that with their caregiver. Your caregiver will help you understand the benefits and risks. The ideal dose of hormone replacement therapy is not known. The Food and Drug Administration (FDA) has concluded that hormone therapy should be used only at the lowest doses and for the shortest amount of time to reach treatment goals.  OSTEOPOROSIS Protecting Against Bone Loss and Preventing Fracture: If you use hormone therapy for prevention of bone loss (osteoporosis), the risks for bone loss must outweigh the risk of the therapy. Ask your caregiver about other medications known to be safe and effective for preventing bone loss and fractures. To guard against bone loss or fractures, the following is recommended:  If  you are less than age 50, take 1000 mg of calcium and at least 600 mg of Vitamin D per day.  If you are greater than age 50 but less than age 70, take 1200 mg of calcium and at least 600 mg of Vitamin D per day.  If you are greater than age 70, take 1200 mg of calcium and at least 800 mg of Vitamin D per day. Smoking and excessive alcohol intake increases the risk of osteoporosis. Eat foods rich in calcium and vitamin D and do weight bearing exercises several times a week as your caregiver suggests. DIABETES Diabetes Melitus: If you have Type I or Type 2 diabetes, you should keep your blood sugar under control with diet, exercise and recommended medication. Avoid too many sweets, starchy and fatty foods. Being overweight can make control more difficult. COGNITION AND MEMORY Cognition and Memory: Menopausal hormone therapy is not recommended for the prevention of cognitive disorders such as Alzheimer's disease or memory loss.  DEPRESSION  Depression may occur at any age, but is common in elderly women. The reasons may be because of physical, medical, social (loneliness), or financial problems and needs. If you are experiencing depression because of medical problems and control of symptoms, talk to your caregiver about this. Physical activity and   exercise may help with mood and sleep. Community and volunteer involvement may help your sense of value and worth. If you have depression and you feel that the problem is getting worse or becoming severe, talk to your caregiver about treatment options that are best for you. ACCIDENTS  Accidents are common and can be serious in the elderly woman. Prepare your house to prevent accidents. Eliminate throw rugs, place hand bars in the bath, shower and toilet areas. Avoid wearing high heeled shoes or walking on wet, snowy, and icy areas. Limit or stop driving if you have vision or hearing problems, or you feel you are unsteady with you movements and  reflexes. HEPATITIS C Hepatitis C is a type of viral infection affecting the liver. It is spread mainly through contact with blood from an infected person. It can be treated, but if left untreated, it can lead to severe liver damage over years. Many people who are infected do not know that the virus is in their blood. If you are a "baby-boomer", it is recommended that you have one screening test for Hepatitis C. IMMUNIZATIONS  Several immunizations are important to consider having during your senior years, including:   Tetanus, diptheria, and pertussis booster shot.  Influenza every year before the flu season begins.  Pneumonia vaccine.  Shingles vaccine.  Others as indicated based on your specific needs. Talk to your caregiver about these. Document Released: 10/21/2005 Document Revised: 08/15/2012 Document Reviewed: 06/16/2008 Exit[employer] Patient Information 2014 [employer].  

## 2014-02-21 LAB — LIPID PANEL
CHOL/HDL RATIO: 3.4 ratio
CHOLESTEROL: 200 mg/dL (ref 0–200)
HDL: 59 mg/dL (ref >39)
LDL CALC: 123 mg/dL — AB (ref 0–99)
TRIGLYCERIDES: 88 mg/dL (ref <150)
VLDL: 18 mg/dL (ref 0–40)

## 2014-02-21 LAB — COMPREHENSIVE METABOLIC PANEL
ALK PHOS: 62 U/L (ref 39–117)
ALT: 20 U/L (ref 0–35)
AST: 19 U/L (ref 0–37)
Albumin: 4.2 g/dL (ref 3.5–5.2)
BILIRUBIN TOTAL: 0.4 mg/dL (ref 0.2–1.2)
BUN: 13 mg/dL (ref 6–23)
CO2: 25 meq/L (ref 19–32)
CREATININE: 0.6 mg/dL (ref 0.50–1.10)
Calcium: 9 mg/dL (ref 8.4–10.5)
Chloride: 104 mEq/L (ref 96–112)
GLUCOSE: 91 mg/dL (ref 70–99)
Potassium: 3.9 mEq/L (ref 3.5–5.3)
SODIUM: 139 meq/L (ref 135–145)
Total Protein: 6.5 g/dL (ref 6.0–8.3)

## 2014-02-21 LAB — URINALYSIS W MICROSCOPIC + REFLEX CULTURE
BACTERIA UA: NONE SEEN
BILIRUBIN URINE: NEGATIVE
CRYSTALS: NONE SEEN
Casts: NONE SEEN
Glucose, UA: NEGATIVE mg/dL
HGB URINE DIPSTICK: NEGATIVE
KETONES UR: NEGATIVE mg/dL
NITRITE: NEGATIVE
Protein, ur: NEGATIVE mg/dL
Specific Gravity, Urine: 1.013 (ref 1.005–1.030)
UROBILINOGEN UA: 0.2 mg/dL (ref 0.0–1.0)
pH: 5 (ref 5.0–8.0)

## 2014-02-21 LAB — TSH: TSH: 1.305 u[IU]/mL (ref 0.350–4.500)

## 2014-02-22 LAB — URINE CULTURE
COLONY COUNT: NO GROWTH
ORGANISM ID, BACTERIA: NO GROWTH

## 2014-03-03 LAB — CYTOLOGY - PAP

## 2014-07-14 ENCOUNTER — Encounter: Payer: Self-pay | Admitting: Women's Health

## 2014-07-17 ENCOUNTER — Other Ambulatory Visit: Payer: Self-pay

## 2014-07-17 DIAGNOSIS — Z1231 Encounter for screening mammogram for malignant neoplasm of breast: Secondary | ICD-10-CM

## 2014-07-18 ENCOUNTER — Ambulatory Visit
Admission: RE | Admit: 2014-07-18 | Discharge: 2014-07-18 | Disposition: A | Discharge status: Home / Self Care | Payer: 59 | Source: Ambulatory Visit

## 2014-07-18 DIAGNOSIS — Z1231 Encounter for screening mammogram for malignant neoplasm of breast: Secondary | ICD-10-CM

## 2015-10-16 ENCOUNTER — Other Ambulatory Visit: Payer: Self-pay

## 2015-10-16 DIAGNOSIS — Z1231 Encounter for screening mammogram for malignant neoplasm of breast: Secondary | ICD-10-CM

## 2015-11-09 ENCOUNTER — Ambulatory Visit
Admission: RE | Admit: 2015-11-09 | Discharge: 2015-11-09 | Disposition: A | Discharge status: Home / Self Care | Payer: 59 | Source: Ambulatory Visit

## 2015-11-09 DIAGNOSIS — Z1231 Encounter for screening mammogram for malignant neoplasm of breast: Secondary | ICD-10-CM | POA: Diagnosis not present

## 2016-12-16 ENCOUNTER — Other Ambulatory Visit: Payer: Self-pay | Admitting: Gynecology

## 2016-12-16 DIAGNOSIS — Z1231 Encounter for screening mammogram for malignant neoplasm of breast: Secondary | ICD-10-CM

## 2017-01-03 ENCOUNTER — Ambulatory Visit
Admission: RE | Admit: 2017-01-03 | Discharge: 2017-01-03 | Disposition: A | Discharge status: Home / Self Care | Payer: 59 | Source: Ambulatory Visit | Attending: Gynecology | Admitting: Gynecology

## 2017-01-03 DIAGNOSIS — Z1231 Encounter for screening mammogram for malignant neoplasm of breast: Secondary | ICD-10-CM | POA: Diagnosis not present

## 2017-11-07 ENCOUNTER — Ambulatory Visit: Payer: 59 | Admitting: Family Medicine

## 2017-11-21 ENCOUNTER — Encounter: Payer: Self-pay | Admitting: Family Medicine

## 2017-11-21 ENCOUNTER — Other Ambulatory Visit: Payer: Self-pay

## 2017-11-21 ENCOUNTER — Ambulatory Visit (INDEPENDENT_AMBULATORY_CARE_PROVIDER_SITE_OTHER): Payer: 59 | Admitting: Family Medicine

## 2017-11-21 VITALS — BP 102/72 | HR 62 | Temp 98.7°F | Ht 63.0 in | Wt 157.0 lb

## 2017-11-21 DIAGNOSIS — Z1159 Encounter for screening for other viral diseases: Secondary | ICD-10-CM

## 2017-11-21 DIAGNOSIS — Z0000 Encounter for general adult medical examination without abnormal findings: Secondary | ICD-10-CM | POA: Diagnosis not present

## 2017-11-21 NOTE — Progress Notes (Signed)
Subjective  Chief Complaint  Patient presents with  . Establish Care    Patient states that she never had a PCP, [employer]    HPI: Teresa Sharp is a 62 y.o. female who presents to [place] at [facility] today for a Female Wellness Visit. She is a Immunologist at [employer]. She has a GYN - Dr. Phineas Real. She is healthy and has no concerns.  Wellness Visit: annual visit with health maintenance review and exam without Pap   Healthy, works out at [employer] regularly x 2 years. Eats healthy. No mood problems.   HM: may be due for Tdap but not certain. She will check her records. Due for eye exam. mammo in April. She will schedule with breast center. Lifestyle: Body mass index is 27.81 kg/m. Wt Readings from Last 3 Encounters:  11/21/17 157 lb (71.2 kg)  02/20/14 141 lb 3.2 oz (64 kg)  02/15/13 145 lb 4.5 oz (65.9 kg)   Diet: low fat Exercise: frequently  There are no active problems to display for this patient.  Health Maintenance  Topic Date Due  . Hepatitis C Screening  [birth year]  . HIV Screening  05/02/1973  . TETANUS/TDAP  05/02/1977  . MAMMOGRAM  01/03/2018  . PAP SMEAR  12/31/2019  . COLONOSCOPY  06/05/2022  . INFLUENZA VACCINE  Completed   Immunization History  Administered Date(s) Administered  . Influenza-Unspecified 06/12/2017   We updated and reviewed the patient's past history in detail and it is documented below. Allergies: Patient has No Known Allergies. Past Medical History Patient  has a past medical history of Insomnia and PONV (postoperative nausea and vomiting). Past Surgical History Patient  has a past surgical history that includes Cesarean section (1996); Tonsillectomy and adenoidectomy (1965); Colonoscopy; and Anterior cervical decomp/discectomy fusion (N/A, 02/19/2013). Family History: Patient family history includes Breast cancer in her maternal aunt and maternal grandmother; Diabetes in her maternal grandmother and mother;  Heart disease in her father and maternal grandfather; Hypertension in her father. Social History:  Patient  reports that  has never smoked. she has never used smokeless tobacco. She reports that she does not drink alcohol or use drugs.  Review of Systems: Constitutional: negative for fever or malaise Ophthalmic: negative for photophobia, double vision or loss of vision Cardiovascular: negative for chest pain, dyspnea on exertion, or new LE swelling Respiratory: negative for SOB or persistent cough Gastrointestinal: negative for abdominal pain, change in bowel habits or melena Genitourinary: negative for dysuria or gross hematuria, no abnormal uterine bleeding or disharge Musculoskeletal: negative for new gait disturbance or muscular weakness Integumentary: negative for new or persistent rashes, no breast lumps Neurological: negative for TIA or stroke symptoms Psychiatric: negative for SI or delusions Allergic/Immunologic: negative for hives  Patient Care Team    Relationship Specialty Notifications Start End  Leamon Arnt, MD PCP - General Family Medicine  11/21/17   Fontaine, Belinda Block, MD Consulting Physician Gynecology  11/21/17     Objective  Vitals: BP 102/72   Pulse 62   Temp 98.7 F (37.1 C)   Ht 5\' 3"  (1.6 m)   Wt 157 lb (71.2 kg)   LMP 02/10/2013   BMI 27.81 kg/m  General:  Well developed, well nourished, no acute distress  Psych:  Alert and orientedx3,normal mood and affect HEENT:  Normocephalic, atraumatic, non-icteric sclera, PERRL, oropharynx is clear without mass or exudate, supple neck without adenopathy, mass or thyromegaly Cardiovascular:  Normal S1, S2, RRR without gallop,  rub or murmur, nondisplaced PMI Respiratory:  Good breath sounds bilaterally, CTAB with normal respiratory effort Gastrointestinal: normal bowel sounds, soft, non-tender, no noted masses. No HSM MSK: no deformities, contusions. Joints are without erythema or swelling. Spine and CVA region  are nontender Skin:  Warm, no rashes or suspicious lesions noted Neurologic:    Mental status is normal. CN 2-11 are normal. Gross motor and sensory exams are normal. Normal gait. No tremor Breast Exam: No mass, skin retraction or nipple discharge is appreciated in either breast. No axillary adenopathy. Fibrocystic changes are not noted  Assessment  1. Annual physical exam   2. Need for hepatitis C screening test      Plan  Female Wellness Visit:  Age appropriate Health Maintenance and Prevention measures were discussed with patient. Included topics are cancer screening recommendations, ways to keep healthy (see AVS) including dietary and exercise recommendations, regular eye and dental care, use of seat belts, and avoidance of moderate alcohol use and tobacco use.   BMI: discussed patient's BMI and encouraged positive lifestyle modifications to help get to or maintain a target BMI.  HM needs and immunizations were addressed and ordered. See below for orders. See HM and immunization section for updates.  Routine labs and screening tests ordered including cmp, cbc and lipids where appropriate.  Discussed recommendations regarding Vit D and calcium supplementation (see AVS)  Follow up: 12 months for cpe   Commons side effects, risks, benefits, and alternatives for medications and treatment plan prescribed today were discussed, and the patient expressed understanding of the given instructions. Patient is instructed to call or message via MyChart if he/she has any questions or concerns regarding our treatment plan. No barriers to understanding were identified. We discussed Red Flag symptoms and signs in detail. Patient expressed understanding regarding what to do in case of urgent or emergency type symptoms.   Medication list was reconciled, printed and provided to the patient in AVS. Patient instructions and summary information was reviewed with the patient as documented in the AVS. This  note was prepared with assistance of Dragon voice recognition software. Occasional wrong-word or sound-a-like substitutions may have occurred due to the inherent limitations of voice recognition software  Orders Placed This Encounter  Procedures  . CBC with Differential/Platelet  . Comprehensive metabolic panel  . Lipid panel  . HIV antibody  . Hepatitis C antibody   No orders of the defined types were placed in this encounter.

## 2017-11-21 NOTE — Patient Instructions (Addendum)
Please return in 12 months for your annual complete physical; please come fasting.  Please find out when you had your last Tdap and let me know via Mychart.  Please schedule an appointment with an eye doctor.   I will release your lab results to you on your MyChart account with further instructions. Please reply with any questions.   It was a pleasure meeting you today! Thank you for choosing [place] to meet your healthcare needs! I truly look forward to working with you. If you have any questions or concerns, please send me a message via Mychart or call the office at [phone].

## 2017-11-25 LAB — ADD ON CMP
AG RATIO: 1.6 (calc) (ref 1.0–2.5)
ALKALINE PHOSPHATASE (APISO): 84 U/L (ref 33–130)
ALT: 14 U/L (ref 6–29)
AST: 20 U/L (ref 10–35)
Albumin: 4.8 g/dL (ref 3.6–5.1)
BILIRUBIN TOTAL: 0.3 mg/dL (ref 0.2–1.2)
BUN / CREAT RATIO: 26 (calc) — AB (ref 6–22)
BUN: 27 mg/dL — AB (ref 7–25)
CHLORIDE: 99 mmol/L (ref 98–110)
CO2: <10 mmol/L — ABNORMAL LOW (ref 20–32)
Calcium: 10.5 mg/dL — ABNORMAL HIGH (ref 8.6–10.4)
Creat: 1.04 mg/dL (ref 0.50–1.05)
GFR, EST AFRICAN AMERICAN: 68 mL/min/{1.73_m2} (ref >=60)
GFR, Est Non African American: 59 mL/min/{1.73_m2} — ABNORMAL LOW (ref >=60)
GLUCOSE: 105 mg/dL — AB (ref 65–99)
Globulin: 3 g/dL (calc) (ref 1.9–3.7)
Potassium: 4.7 mmol/L (ref 3.5–5.3)
Sodium: 139 mmol/L (ref 135–146)
TOTAL PROTEIN: 7.8 g/dL (ref 6.1–8.1)

## 2017-11-25 LAB — LIPID PANEL
Cholesterol: 296 mg/dL — ABNORMAL HIGH (ref <200)
HDL: 68 mg/dL (ref >50)
LDL CHOLESTEROL (CALC): 176 mg/dL — AB
Non-HDL Cholesterol (Calc): 228 mg/dL (calc) — ABNORMAL HIGH (ref <130)
TRIGLYCERIDES: 304 mg/dL — AB (ref <150)
Total CHOL/HDL Ratio: 4.4 (calc) (ref <5.0)

## 2017-11-25 LAB — CBC WITH DIFFERENTIAL/PLATELET
BASOS PCT: 0.7 %
Basophils Absolute: 50 cells/uL (ref 0–200)
Eosinophils Absolute: 249 cells/uL (ref 15–500)
Eosinophils Relative: 3.5 %
HEMATOCRIT: 41.2 % (ref 35.0–45.0)
Hemoglobin: 13.7 g/dL (ref 11.7–15.5)
LYMPHS ABS: 2457 {cells}/uL (ref 850–3900)
MCH: 28.6 pg (ref 27.0–33.0)
MCHC: 33.3 g/dL (ref 32.0–36.0)
MCV: 86 fL (ref 80.0–100.0)
MPV: 10.9 fL (ref 7.5–12.5)
Monocytes Relative: 8.5 %
Neutro Abs: 3742 cells/uL (ref 1500–7800)
Neutrophils Relative %: 52.7 %
PLATELETS: 282 10*3/uL (ref 140–400)
RBC: 4.79 10*6/uL (ref 3.80–5.10)
RDW: 13.2 % (ref 11.0–15.0)
TOTAL LYMPHOCYTE: 34.6 %
WBC mixed population: 604 cells/uL (ref 200–950)
WBC: 7.1 10*3/uL (ref 3.8–10.8)

## 2017-11-25 LAB — HIV ANTIBODY (ROUTINE TESTING W REFLEX): HIV 1&2 Ab, 4th Generation: NONREACTIVE

## 2017-11-25 LAB — TEST AUTHORIZATION

## 2017-11-25 LAB — EXTRA LAV TOP TUBE

## 2017-11-25 LAB — HEPATITIS C ANTIBODY
Hepatitis C Ab: NONREACTIVE
SIGNAL TO CUT-OFF: 0.02 (ref <1.00)

## 2017-11-28 ENCOUNTER — Encounter: Payer: Self-pay | Admitting: Family Medicine

## 2018-04-16 ENCOUNTER — Other Ambulatory Visit: Payer: Self-pay | Admitting: Gynecology

## 2018-04-16 DIAGNOSIS — Z1231 Encounter for screening mammogram for malignant neoplasm of breast: Secondary | ICD-10-CM

## 2018-04-20 ENCOUNTER — Ambulatory Visit
Admission: RE | Admit: 2018-04-20 | Discharge: 2018-04-20 | Disposition: A | Discharge status: Home / Self Care | Payer: 59 | Source: Ambulatory Visit | Attending: Gynecology | Admitting: Gynecology

## 2018-04-20 DIAGNOSIS — Z1231 Encounter for screening mammogram for malignant neoplasm of breast: Secondary | ICD-10-CM

## 2018-06-08 ENCOUNTER — Ambulatory Visit (INDEPENDENT_AMBULATORY_CARE_PROVIDER_SITE_OTHER): Payer: 59 | Admitting: Family Medicine

## 2018-06-08 ENCOUNTER — Other Ambulatory Visit: Payer: Self-pay

## 2018-06-08 ENCOUNTER — Encounter: Payer: Self-pay | Admitting: Family Medicine

## 2018-06-08 VITALS — BP 108/70 | HR 64 | Temp 98.7°F | Ht 63.0 in | Wt 142.4 lb

## 2018-06-08 DIAGNOSIS — N6082 Other benign mammary dysplasias of left breast: Secondary | ICD-10-CM

## 2018-06-08 NOTE — Progress Notes (Signed)
   Subjective  CC:  Chief Complaint  Patient presents with  . Breast Problem    Knot in L breast x 3 weeks     HPI: Teresa Sharp is a 62 y.o. female who presents to the office today to address the problems listed above in the chief complaint.  As above, small knot on chest, almost midline. Worries since friend just diagnosed with breast cancer. Has grown in size but no warmth or drainage and minimally sore. Recent mammogram was normal.    Assessment  1. Sebaceous cyst of skin of left breast      Plan   cyst:  Reassured. Benign finding. Discussed risks of infection and what to look for. Monitor for now. May resolve or improve. If worsens, can excise.   Follow up: march 2020   No orders of the defined types were placed in this encounter.  No orders of the defined types were placed in this encounter.     I reviewed the patients updated PMH, FH, and SocHx.    Patient Active Problem List   Diagnosis Date Noted  . Annual physical exam 11/21/2017   No outpatient medications have been marked as taking for the 06/08/18 encounter (Office Visit) with Leamon Arnt, MD.    Allergies: Patient has No Known Allergies. Family History: Patient family history includes Breast cancer in her maternal aunt and maternal grandmother; Diabetes in her maternal grandmother and mother; Healthy in her brother, brother, brother, brother, brother, daughter, and daughter; Heart disease in her father and maternal grandfather; Hyperlipidemia in her father and mother; Hypertension in her father; Peripheral vascular disease in her father. Social History:  Patient  reports that she has never smoked. She has never used smokeless tobacco. She reports that she does not drink alcohol or use drugs.  Review of Systems: Constitutional: Negative for fever malaise or anorexia Cardiovascular: negative for chest pain Respiratory: negative for SOB or persistent cough Gastrointestinal: negative for abdominal  pain  Objective  Vitals: BP 108/70   Pulse 64   Temp 98.7 F (37.1 C)   Ht 5\' 3"  (1.6 m)   Wt 142 lb 6.4 oz (64.6 kg)   LMP 02/10/2013   SpO2 98%   BMI 25.23 kg/m  General: no acute distress , A&Ox3 Left Breast exam: normal w/o mass or nipple retraction or lad Skin:  Warm, no rashes, just left of center of midsternum, 0.5-1cm mobile nontender mass with irregularity c/w cyst     Commons side effects, risks, benefits, and alternatives for medications and treatment plan prescribed today were discussed, and the patient expressed understanding of the given instructions. Patient is instructed to call or message via MyChart if he/she has any questions or concerns regarding our treatment plan. No barriers to understanding were identified. We discussed Red Flag symptoms and signs in detail. Patient expressed understanding regarding what to do in case of urgent or emergency type symptoms.   Medication list was reconciled, printed and provided to the patient in AVS. Patient instructions and summary information was reviewed with the patient as documented in the AVS. This note was prepared with assistance of Dragon voice recognition software. Occasional wrong-word or sound-a-like substitutions may have occurred due to the inherent limitations of voice recognition software

## 2018-06-08 NOTE — Patient Instructions (Signed)
Please return in march 2020 for your annual complete physical; please come fasting.   If you have any questions or concerns, please don't hesitate to send me a message via MyChart or call the office at [phone]. Thank you for visiting with [place] today! It's our pleasure caring for you.   Epidermal Cyst An epidermal cyst is sometimes called an epidermal inclusion cyst or an infundibular cyst. It is a sac made of skin tissue. The sac contains a substance called keratin. Keratin is a protein that is normally secreted through the hair follicles. When keratin becomes trapped in the top layer of skin (epidermis), it can form an epidermal cyst. Epidermal cysts are usually found on the face, neck, trunk, and genitals. These cysts are usually harmless (benign), and they may not cause symptoms unless they become infected. It is important not to pop epidermal cysts yourself. What are the causes? This condition may be caused by:  A blocked hair follicle.  A hair that curls and re-enters the skin instead of growing straight out of the skin (ingrown hair).  A blocked pore.  Irritated skin.  An injury to the skin.  Certain conditions that are passed along from parent to child (inherited).  Human papillomavirus (HPV).  What increases the risk? The following factors may make you more likely to develop an epidermal cyst:  Having acne.  Being overweight.  Wearing tight clothing.  What are the signs or symptoms? The only symptom of this condition may be a small, painless lump underneath the skin. When an epidermal cyst becomes infected, symptoms may include:  Redness.  Inflammation.  Tenderness.  Warmth.  Fever.  Keratin draining from the cyst. Keratin may look like a grayish-white, bad-smelling substance.  Pus draining from the cyst.  How is this diagnosed? This condition is diagnosed with a physical exam. In some cases, you may have a sample of tissue (biopsy) taken from your cyst  to be examined under a microscope or tested for bacteria. You may be referred to a health care provider who specializes in skin care (dermatologist). How is this treated? In many cases, epidermal cysts go away on their own without treatment. If a cyst becomes infected, treatment may include:  Opening and draining the cyst. After draining, minor surgery to remove the rest of the cyst may be done.  Antibiotic medicine to help prevent infection.  Injections of medicines (steroids) that help to reduce inflammation.  Surgery to remove the cyst. Surgery may be done if: ? The cyst becomes large. ? The cyst bothers you. ? There is a chance that the cyst could turn into cancer.  Follow these instructions at home:  Take over-the-counter and prescription medicines only as told by your health care provider.  If you were prescribed an antibiotic, use it as told by your health care provider. Do not stop using the antibiotic even if you start to feel better.  Keep the area around your cyst clean and dry.  Wear loose, dry clothing.  Do not try to pop your cyst.  Avoid touching your cyst.  Check your cyst every day for signs of infection.  Keep all follow-up visits as told by your health care provider. This is important. How is this prevented?  Wear clean, dry, clothing.  Avoid wearing tight clothing.  Keep your skin clean and dry. Shower or take baths every day.  Wash your body with a benzoyl peroxide wash when you shower or bathe. Contact a health care provider if:  Your cyst develops symptoms of infection.  Your condition is not improving or is getting worse.  You develop a cyst that looks different from other cysts you have had.  You have a fever. Get help right away if:  Redness spreads from the cyst into the surrounding area. This information is not intended to replace advice given to you by your health care provider. Make sure you discuss any questions you have with your  health care provider. Document Released: 07/30/2004 Document Revised: 04/27/2016 Document Reviewed: 07/01/2015 Elsevier Interactive Patient Education  [employer].

## 2018-07-05 DIAGNOSIS — L812 Freckles: Secondary | ICD-10-CM | POA: Diagnosis not present

## 2018-07-05 DIAGNOSIS — D2372 Other benign neoplasm of skin of left lower limb, including hip: Secondary | ICD-10-CM | POA: Diagnosis not present

## 2018-07-05 DIAGNOSIS — L723 Sebaceous cyst: Secondary | ICD-10-CM | POA: Diagnosis not present

## 2018-07-05 DIAGNOSIS — L814 Other melanin hyperpigmentation: Secondary | ICD-10-CM | POA: Diagnosis not present

## 2018-07-05 DIAGNOSIS — L821 Other seborrheic keratosis: Secondary | ICD-10-CM | POA: Diagnosis not present

## 2018-08-03 ENCOUNTER — Ambulatory Visit (INDEPENDENT_AMBULATORY_CARE_PROVIDER_SITE_OTHER): Payer: 59 | Admitting: Gynecology

## 2018-08-03 ENCOUNTER — Encounter: Payer: Self-pay | Admitting: Gynecology

## 2018-08-03 VITALS — BP 114/70 | Ht 63.0 in | Wt 149.0 lb

## 2018-08-03 DIAGNOSIS — Z1151 Encounter for screening for human papillomavirus (HPV): Secondary | ICD-10-CM | POA: Diagnosis not present

## 2018-08-03 DIAGNOSIS — Z01419 Encounter for gynecological examination (general) (routine) without abnormal findings: Secondary | ICD-10-CM | POA: Diagnosis not present

## 2018-08-03 DIAGNOSIS — N951 Menopausal and female climacteric states: Secondary | ICD-10-CM

## 2018-08-03 DIAGNOSIS — N952 Postmenopausal atrophic vaginitis: Secondary | ICD-10-CM

## 2018-08-03 NOTE — Patient Instructions (Signed)
Try Estrovin over-the-counter products to see if it does not help with the menopausal symptoms.  Follow-up in 1 year for annual exam.

## 2018-08-03 NOTE — Progress Notes (Signed)
    Teresa Sharp [birth year] [id number]        62 y.o.  [id number] for annual gynecologic exam.  Having some hot flushes and sweats.  Otherwise doing well.  Past medical history,surgical history, problem list, medications, allergies, family history and social history were all reviewed and documented as reviewed in the EPIC chart.  ROS:  Performed with pertinent positives and negatives included in the history, assessment and plan.   Additional significant findings : None   Exam: Caryn Bee assistant Vitals:   08/03/18 1409  BP: 114/70  Weight: 149 lb (67.6 kg)  Height: 5\' 3"  (1.6 m)   Body mass index is 26.39 kg/m.  General appearance:  Normal affect, orientation and appearance. Skin: Grossly normal HEENT: Without gross lesions.  No cervical or supraclavicular adenopathy. Thyroid normal.  Lungs:  Clear without wheezing, rales or rhonchi Cardiac: RR, without RMG Abdominal:  Soft, nontender, without masses, guarding, rebound, organomegaly or hernia Breasts:  Examined lying and sitting without masses, retractions, discharge or axillary adenopathy. Pelvic:  Ext, BUS, Vagina: With atrophic changes  Cervix: With atrophic changes.  Pap smear done  Uterus: Anteverted, normal size, shape and contour, midline and mobile nontender   Adnexa: Without masses or tenderness    Anus and perineum: Normal   Rectovaginal: Normal sphincter tone without palpated masses or tenderness.    Assessment/Plan:  62 y.o. [id number] female for annual gynecologic exam.   1. Postmenopausal/menopausal symptoms.  Having some hot flushes and sweats.  Is not interested in starting HRT.  Discussed OTC product trial.  No bleeding. 2. Colonoscopy 2013.  Repeat at their recommended interval. 3. Mammography 04/2018.  Continue with annual mammography when due.  Breast exam normal today. 4. Pap smear/HPV 02/2014.  Pap smear/HPV done today.  No history of significant abnormal Pap smears. 5. DEXA never.  Will contact and  arrange for baseline. 6. Health maintenance.  No routine lab work done as patient does this elsewhere.  Follow-up in 1 year, sooner as needed.   Anastasio Auerbach MD, 2:49 PM 08/03/2018

## 2018-08-03 NOTE — Addendum Note (Signed)
Addended by: Nelva Nay on: 08/03/2018 03:47 PM   Modules accepted: Orders

## 2018-08-07 LAB — PAP IG AND HPV HIGH-RISK: HPV DNA High Risk: NOT DETECTED

## 2018-10-05 DIAGNOSIS — D485 Neoplasm of uncertain behavior of skin: Secondary | ICD-10-CM | POA: Diagnosis not present

## 2018-10-05 DIAGNOSIS — L570 Actinic keratosis: Secondary | ICD-10-CM | POA: Diagnosis not present

## 2019-05-30 ENCOUNTER — Encounter (INDEPENDENT_AMBULATORY_CARE_PROVIDER_SITE_OTHER): Payer: 59 | Admitting: Ophthalmology

## 2019-05-30 ENCOUNTER — Other Ambulatory Visit: Payer: Self-pay

## 2019-05-30 DIAGNOSIS — H2513 Age-related nuclear cataract, bilateral: Secondary | ICD-10-CM

## 2019-05-30 DIAGNOSIS — H43813 Vitreous degeneration, bilateral: Secondary | ICD-10-CM | POA: Diagnosis not present

## 2019-05-30 DIAGNOSIS — H35372 Puckering of macula, left eye: Secondary | ICD-10-CM

## 2019-06-05 ENCOUNTER — Encounter (INDEPENDENT_AMBULATORY_CARE_PROVIDER_SITE_OTHER): Payer: 59 | Admitting: Ophthalmology

## 2019-06-11 ENCOUNTER — Encounter: Payer: Self-pay | Admitting: Family Medicine

## 2019-06-11 ENCOUNTER — Encounter: Payer: Self-pay | Admitting: Gynecology

## 2019-06-11 ENCOUNTER — Other Ambulatory Visit: Payer: Self-pay

## 2019-06-11 ENCOUNTER — Ambulatory Visit (INDEPENDENT_AMBULATORY_CARE_PROVIDER_SITE_OTHER): Payer: 59 | Admitting: Family Medicine

## 2019-06-11 VITALS — BP 104/68 | HR 63 | Temp 97.1°F | Resp 16 | Ht 63.0 in | Wt 158.0 lb

## 2019-06-11 DIAGNOSIS — Z1239 Encounter for other screening for malignant neoplasm of breast: Secondary | ICD-10-CM

## 2019-06-11 DIAGNOSIS — Z0000 Encounter for general adult medical examination without abnormal findings: Secondary | ICD-10-CM | POA: Diagnosis not present

## 2019-06-11 DIAGNOSIS — Z23 Encounter for immunization: Secondary | ICD-10-CM | POA: Diagnosis not present

## 2019-06-11 LAB — CBC WITH DIFFERENTIAL/PLATELET
Basophils Absolute: 0 10*3/uL (ref 0.0–0.1)
Basophils Relative: 0.6 % (ref 0.0–3.0)
Eosinophils Absolute: 0.1 10*3/uL (ref 0.0–0.7)
Eosinophils Relative: 1.5 % (ref 0.0–5.0)
HCT: 38.3 % (ref 36.0–46.0)
Hemoglobin: 12.8 g/dL (ref 12.0–15.0)
Lymphocytes Relative: 33.7 % (ref 12.0–46.0)
Lymphs Abs: 2.1 10*3/uL (ref 0.7–4.0)
MCHC: 33.4 g/dL (ref 30.0–36.0)
MCV: 85.6 fl (ref 78.0–100.0)
Monocytes Absolute: 0.3 10*3/uL (ref 0.1–1.0)
Monocytes Relative: 5.2 % (ref 3.0–12.0)
Neutro Abs: 3.6 10*3/uL (ref 1.4–7.7)
Neutrophils Relative %: 59 % (ref 43.0–77.0)
Platelets: 247 10*3/uL (ref 150.0–400.0)
RBC: 4.47 Mil/uL (ref 3.87–5.11)
RDW: 13 % (ref 11.5–15.5)
WBC: 6.1 10*3/uL (ref 4.0–10.5)

## 2019-06-11 LAB — COMPREHENSIVE METABOLIC PANEL
ALT: 11 U/L (ref 0–35)
AST: 14 U/L (ref 0–37)
Albumin: 4.1 g/dL (ref 3.5–5.2)
Alkaline Phosphatase: 67 U/L (ref 39–117)
BUN: 13 mg/dL (ref 6–23)
CO2: 24 mEq/L (ref 19–32)
Calcium: 9.2 mg/dL (ref 8.4–10.5)
Chloride: 106 mEq/L (ref 96–112)
Creatinine, Ser: 0.8 mg/dL (ref 0.40–1.20)
GFR: 72.9 mL/min (ref >60.00)
Glucose, Bld: 91 mg/dL (ref 70–99)
Potassium: 4.2 mEq/L (ref 3.5–5.1)
Sodium: 140 mEq/L (ref 135–145)
Total Bilirubin: 0.4 mg/dL (ref 0.2–1.2)
Total Protein: 6.4 g/dL (ref 6.0–8.3)

## 2019-06-11 LAB — LIPID PANEL
Cholesterol: 211 mg/dL — ABNORMAL HIGH (ref 0–200)
HDL: 53.1 mg/dL (ref >39.00)
LDL Cholesterol: 131 mg/dL — ABNORMAL HIGH (ref 0–99)
NonHDL: 157.52
Total CHOL/HDL Ratio: 4
Triglycerides: 133 mg/dL (ref 0.0–149.0)
VLDL: 26.6 mg/dL (ref 0.0–40.0)

## 2019-06-11 LAB — TSH: TSH: 1.22 u[IU]/mL (ref 0.35–4.50)

## 2019-06-11 NOTE — Patient Instructions (Addendum)
Please return in 12 months for your annual complete physical; please come fasting.  I will release your lab results to you on your MyChart account with further instructions. Please reply with any questions.  Today you were given your Tdap vaccination.   If you have any questions or concerns, please don't hesitate to send me a message via MyChart or call the office at [phone]. Thank you for visiting with Teresa Sharp today! It's our pleasure caring for you.   Preventive Care 63 Years Old, Female Preventive care refers to visits with your health care provider and lifestyle choices that can promote health and wellness. This includes:  A yearly physical exam. This may also be called an annual well check.  Regular dental visits and eye exams.  Immunizations.  Screening for certain conditions.  Healthy lifestyle choices, such as eating a healthy diet, getting regular exercise, not using drugs or products that contain nicotine and tobacco, and limiting alcohol use. What can I expect for my preventive care visit? Physical exam Your health care provider will check your:  Height and weight. This may be used to calculate body mass index (BMI), which tells if you are at a healthy weight.  Heart rate and blood pressure.  Skin for abnormal spots. Counseling Your health care provider may ask you questions about your:  Alcohol, tobacco, and drug use.  Emotional well-being.  Home and relationship well-being.  Sexual activity.  Eating habits.  Work and work Statistician.  Method of birth control.  Menstrual cycle.  Pregnancy history. What immunizations do I need?  Influenza (flu) vaccine  This is recommended every year. Tetanus, diphtheria, and pertussis (Tdap) vaccine  You may need a Td booster every 10 years. Varicella (chickenpox) vaccine  You may need this if you have not been vaccinated. Zoster (shingles) vaccine  You may need this after age 61. Measles, mumps, and  rubella (MMR) vaccine  You may need at least one dose of MMR if you were born in 1957 or later. You may also need a second dose. Pneumococcal conjugate (PCV13) vaccine  You may need this if you have certain conditions and were not previously vaccinated. Pneumococcal polysaccharide (PPSV23) vaccine  You may need one or two doses if you smoke cigarettes or if you have certain conditions. Meningococcal conjugate (MenACWY) vaccine  You may need this if you have certain conditions. Hepatitis A vaccine  You may need this if you have certain conditions or if you travel or work in places where you may be exposed to hepatitis A. Hepatitis B vaccine  You may need this if you have certain conditions or if you travel or work in places where you may be exposed to hepatitis B. Haemophilus influenzae type b (Hib) vaccine  You may need this if you have certain conditions. Human papillomavirus (HPV) vaccine  If recommended by your health care provider, you may need three doses over 6 months. You may receive vaccines as individual doses or as more than one vaccine together in one shot (combination vaccines). Talk with your health care provider about the risks and benefits of combination vaccines. What tests do I need? Blood tests  Lipid and cholesterol levels. These may be checked every 5 years, or more frequently if you are over 39 years old.  Hepatitis C test.  Hepatitis B test. Screening  Lung cancer screening. You may have this screening every year starting at age 35 if you have a 30-pack-year history of smoking and currently smoke or have quit  within the past 15 years.  Colorectal cancer screening. All adults should have this screening starting at age 17 and continuing until age 29. Your health care provider may recommend screening at age 74 if you are at increased risk. You will have tests every 1-10 years, depending on your results and the type of screening test.  Diabetes screening. This  is done by checking your blood sugar (glucose) after you have not eaten for a while (fasting). You may have this done every 1-3 years.  Mammogram. This may be done every 1-2 years. Talk with your health care provider about when you should start having regular mammograms. This may depend on whether you have a family history of breast cancer.  BRCA-related cancer screening. This may be done if you have a family history of breast, ovarian, tubal, or peritoneal cancers.  Pelvic exam and Pap test. This may be done every 3 years starting at age 61. Starting at age 7, this may be done every 5 years if you have a Pap test in combination with an HPV test. Other tests  Sexually transmitted disease (STD) testing.  Bone density scan. This is done to screen for osteoporosis. You may have this scan if you are at high risk for osteoporosis. Follow these instructions at home: Eating and drinking  Eat a diet that includes fresh fruits and vegetables, whole grains, lean protein, and low-fat dairy.  Take vitamin and mineral supplements as recommended by your health care provider.  Do not drink alcohol if: ? Your health care provider tells you not to drink. ? You are pregnant, may be pregnant, or are planning to become pregnant.  If you drink alcohol: ? Limit how much you have to 0-1 drink a day. ? Be aware of how much alcohol is in your drink. In the [place] one drink equals one 12 oz bottle of beer (355 mL), one 5 oz glass of wine (148 mL), or one 1 oz glass of hard liquor (44 mL). Lifestyle  Take daily care of your teeth and gums.  Stay active. Exercise for at least 30 minutes on 5 or more days each week.  Do not use any products that contain nicotine or tobacco, such as cigarettes, e-cigarettes, and chewing tobacco. If you need help quitting, ask your health care provider.  If you are sexually active, practice safe sex. Use a condom or other form of birth control (contraception) in order to prevent  pregnancy and STIs (sexually transmitted infections).  If told by your health care provider, take low-dose aspirin daily starting at age 61. What's next?  Visit your health care provider once a year for a well check visit.  Ask your health care provider how often you should have your eyes and teeth checked.  Stay up to date on all vaccines. This information is not intended to replace advice given to you by your health care provider. Make sure you discuss any questions you have with your health care provider. Document Released: 09/25/2015 Document Revised: 05/10/2018 Document Reviewed: 05/10/2018 Elsevier Patient Education  2020 [employer].

## 2019-06-11 NOTE — Addendum Note (Signed)
Addended by: Layla Barter on: 06/11/2019 08:43 AM   Modules accepted: Orders

## 2019-06-11 NOTE — Progress Notes (Signed)
Subjective  Chief Complaint  Patient presents with  . Annual Exam    Has had a diet coke    HPI: Teresa Sharp is a 63 y.o. female who presents to [facility] [place] today for a Female Wellness Visit.   Wellness Visit: annual visit with health maintenance review and exam without Pap   HM: due mammogram. Sees GYN for female wellness. No concerns. Has gained weight due to covid: gym closed and eating differently. Due tdap. Gets flu shot at [facility].   Assessment  1. Annual physical exam   2. Breast cancer screening      Plan  Female Wellness Visit:  Age appropriate Health Maintenance and Prevention measures were discussed with patient. Included topics are cancer screening recommendations, ways to keep healthy (see AVS) including dietary and exercise recommendations, regular eye and dental care, use of seat belts, and avoidance of moderate alcohol use and tobacco use. mammo ordered. Other screens up to date  BMI: discussed patient's BMI and encouraged positive lifestyle modifications to help get to or maintain a target BMI. Start exercising again; eating low fat.   HM needs and immunizations were addressed and ordered. See below for orders. See HM and immunization section for updates. tdap   Routine labs and screening tests ordered including cmp, cbc and lipids where appropriate.  Discussed recommendations regarding Vit D and calcium supplementation (see AVS)  Follow up: Return in about 1 year (around 06/10/2020) for complete physical.   Orders Placed This Encounter  Procedures  . MM DIGITAL SCREENING BILATERAL  . CBC with Differential/Platelet  . Comprehensive metabolic panel  . Lipid panel  . TSH   No orders of the defined types were placed in this encounter.    Lifestyle: Body mass index is 27.99 kg/m. Wt Readings from Last 3 Encounters:  06/11/19 158 lb (71.7 kg)  08/03/18 149 lb (67.6 kg)  06/08/18 142 lb 6.4 oz (64.6 kg)    The 10-year ASCVD  risk score Mikey Bussing DC Jr., et al., 2013) is: 2.9%   Values used to calculate the score:     Age: 25 years     Sex: Female     Is Non-Hispanic African American: No     Diabetic: No     Tobacco smoker: No     Systolic Blood Pressure: 123456 mmHg     Is BP treated: No     HDL Cholesterol: 68 mg/dL     Total Cholesterol: 296 mg/dL  Patient Active Problem List   Diagnosis Date Noted  . Annual physical exam 11/21/2017   Health Maintenance  Topic Date Due  . Samul Dada  05/02/1977  . MAMMOGRAM  04/21/2019  . PAP SMEAR-Modifier  08/03/2021  . COLONOSCOPY  06/05/2022  . INFLUENZA VACCINE  Completed  . Hepatitis C Screening  Completed  . HIV Screening  Completed   Immunization History  Administered Date(s) Administered  . Influenza-Unspecified 06/12/2017   We updated and reviewed the patient's past history in detail and it is documented below. Allergies: Patient has No Known Allergies. Past Medical History Patient  has a past medical history of Insomnia and PONV (postoperative nausea and vomiting). Past Surgical History Patient  has a past surgical history that includes Cesarean section (1996); Tonsillectomy and adenoidectomy (1965); Colonoscopy; and Anterior cervical decomp/discectomy fusion (N/A, 02/19/2013). Family History: Patient family history includes Breast cancer in her maternal aunt and maternal grandmother; Diabetes in her maternal grandmother and mother; Healthy in her brother, brother,  brother, brother, brother, daughter, and daughter; Heart disease in her father and maternal grandfather; Hyperlipidemia in her father and mother; Hypertension in her father; Peripheral vascular disease in her father. Social History:  Patient  reports that she has never smoked. She has never used smokeless tobacco. She reports current alcohol use. She reports that she does not use drugs.  Review of Systems: Constitutional: negative for fever or malaise Ophthalmic: negative for photophobia,  double vision or loss of vision Cardiovascular: negative for chest pain, dyspnea on exertion, or new LE swelling Respiratory: negative for SOB or persistent cough Gastrointestinal: negative for abdominal pain, change in bowel habits or melena Genitourinary: negative for dysuria or gross hematuria, no abnormal uterine bleeding or disharge Musculoskeletal: negative for new gait disturbance or muscular weakness Integumentary: negative for new or persistent rashes, no breast lumps Neurological: negative for TIA or stroke symptoms Psychiatric: negative for SI or delusions Allergic/Immunologic: negative for hives  Patient Care Team    Relationship Specialty Notifications Start End  Leamon Arnt, MD PCP - General Family Medicine  11/21/17   Fontaine, Belinda Block, MD Consulting Physician Gynecology  11/21/17     Objective  Vitals: BP 104/68   Pulse 63   Temp (!) 97.1 F (36.2 C) (Tympanic)   Resp 16   Ht 5\' 3"  (1.6 m)   Wt 158 lb (71.7 kg)   LMP 02/10/2013   SpO2 98%   BMI 27.99 kg/m  General:  Well developed, well nourished, no acute distress  Psych:  Alert and orientedx3,normal mood and affect HEENT:  Normocephalic, atraumatic, non-icteric sclera, PERRL, oropharynx is clear without mass or exudate, supple neck without adenopathy, mass or thyromegaly Cardiovascular:  Normal S1, S2, RRR without gallop, rub or murmur, nondisplaced PMI Respiratory:  Good breath sounds bilaterally, CTAB with normal respiratory effort Gastrointestinal: normal bowel sounds, soft, non-tender, no noted masses. No HSM MSK: no deformities, contusions. Joints are without erythema or swelling. Spine and CVA region are nontender Skin:  Warm, no rashes or suspicious lesions noted Neurologic:    Mental status is normal. CN 2-11 are normal. Gross motor and sensory exams are normal. Normal gait. No tremor    Commons side effects, risks, benefits, and alternatives for medications and treatment plan prescribed today  were discussed, and the patient expressed understanding of the given instructions. Patient is instructed to call or message via MyChart if he/she has any questions or concerns regarding our treatment plan. No barriers to understanding were identified. We discussed Red Flag symptoms and signs in detail. Patient expressed understanding regarding what to do in case of urgent or emergency type symptoms.   Medication list was reconciled, printed and provided to the patient in AVS. Patient instructions and summary information was reviewed with the patient as documented in the AVS. This note was prepared with assistance of Dragon voice recognition software. Occasional wrong-word or sound-a-like substitutions may have occurred due to the inherent limitations of voice recognition software

## 2019-07-11 ENCOUNTER — Encounter (INDEPENDENT_AMBULATORY_CARE_PROVIDER_SITE_OTHER): Payer: 59 | Admitting: Ophthalmology

## 2019-08-23 ENCOUNTER — Ambulatory Visit: Payer: 59

## 2019-08-28 ENCOUNTER — Other Ambulatory Visit: Payer: Self-pay

## 2019-08-28 ENCOUNTER — Ambulatory Visit
Admission: RE | Admit: 2019-08-28 | Discharge: 2019-08-28 | Disposition: A | Discharge status: Home / Self Care | Payer: 59 | Source: Ambulatory Visit | Attending: Family Medicine | Admitting: Family Medicine

## 2019-08-28 ENCOUNTER — Other Ambulatory Visit: Payer: Self-pay | Admitting: Family Medicine

## 2019-08-28 DIAGNOSIS — Z0000 Encounter for general adult medical examination without abnormal findings: Secondary | ICD-10-CM

## 2019-08-28 DIAGNOSIS — Z1231 Encounter for screening mammogram for malignant neoplasm of breast: Secondary | ICD-10-CM | POA: Diagnosis not present

## 2019-08-28 DIAGNOSIS — Z1239 Encounter for other screening for malignant neoplasm of breast: Secondary | ICD-10-CM

## 2019-09-19 DIAGNOSIS — L820 Inflamed seborrheic keratosis: Secondary | ICD-10-CM | POA: Diagnosis not present

## 2019-09-19 DIAGNOSIS — L812 Freckles: Secondary | ICD-10-CM | POA: Diagnosis not present

## 2019-09-19 DIAGNOSIS — L814 Other melanin hyperpigmentation: Secondary | ICD-10-CM | POA: Diagnosis not present

## 2019-09-19 DIAGNOSIS — L570 Actinic keratosis: Secondary | ICD-10-CM | POA: Diagnosis not present

## 2019-09-19 DIAGNOSIS — L821 Other seborrheic keratosis: Secondary | ICD-10-CM | POA: Diagnosis not present

## 2020-07-22 DIAGNOSIS — Z20822 Contact with and (suspected) exposure to covid-19: Secondary | ICD-10-CM | POA: Diagnosis not present

## 2020-08-05 DIAGNOSIS — M5030 Other cervical disc degeneration, unspecified cervical region: Secondary | ICD-10-CM | POA: Diagnosis not present

## 2020-08-05 DIAGNOSIS — M542 Cervicalgia: Secondary | ICD-10-CM | POA: Diagnosis not present

## 2020-08-05 DIAGNOSIS — M4802 Spinal stenosis, cervical region: Secondary | ICD-10-CM | POA: Diagnosis not present

## 2020-08-05 DIAGNOSIS — M5412 Radiculopathy, cervical region: Secondary | ICD-10-CM | POA: Diagnosis not present

## 2020-08-05 DIAGNOSIS — M1990 Unspecified osteoarthritis, unspecified site: Secondary | ICD-10-CM | POA: Diagnosis not present

## 2020-10-13 DIAGNOSIS — Z20828 Contact with and (suspected) exposure to other viral communicable diseases: Secondary | ICD-10-CM | POA: Diagnosis not present

## 2021-01-14 DIAGNOSIS — Z20822 Contact with and (suspected) exposure to covid-19: Secondary | ICD-10-CM | POA: Diagnosis not present

## 2021-01-28 ENCOUNTER — Telehealth: Payer: Self-pay

## 2021-01-28 ENCOUNTER — Other Ambulatory Visit: Payer: Self-pay | Admitting: Family Medicine

## 2021-01-28 DIAGNOSIS — Z1231 Encounter for screening mammogram for malignant neoplasm of breast: Secondary | ICD-10-CM

## 2021-01-28 NOTE — Telephone Encounter (Signed)
patient is [employer] employee and needs a CPE by July 1st and would like to know if she can get worked in before than for a CPE Please advise

## 2021-01-28 NOTE — Telephone Encounter (Signed)
Dr. Jonni Sanger, please see message and advise.

## 2021-01-29 ENCOUNTER — Other Ambulatory Visit: Payer: Self-pay

## 2021-01-29 ENCOUNTER — Ambulatory Visit
Admission: RE | Admit: 2021-01-29 | Discharge: 2021-01-29 | Disposition: A | Discharge status: Home / Self Care | Payer: 59 | Source: Ambulatory Visit | Attending: Family Medicine | Admitting: Family Medicine

## 2021-01-29 DIAGNOSIS — Z1231 Encounter for screening mammogram for malignant neoplasm of breast: Secondary | ICD-10-CM

## 2021-01-29 NOTE — Telephone Encounter (Signed)
Patient has been scheduled with [facility]

## 2021-03-09 ENCOUNTER — Encounter: Payer: Self-pay | Admitting: Family

## 2021-03-09 ENCOUNTER — Ambulatory Visit (INDEPENDENT_AMBULATORY_CARE_PROVIDER_SITE_OTHER): Payer: 59 | Admitting: Family

## 2021-03-09 VITALS — BP 100/60 | HR 58 | Temp 97.4°F | Ht 63.0 in | Wt 155.6 lb

## 2021-03-09 DIAGNOSIS — H6121 Impacted cerumen, right ear: Secondary | ICD-10-CM

## 2021-03-09 DIAGNOSIS — Z0000 Encounter for general adult medical examination without abnormal findings: Secondary | ICD-10-CM

## 2021-03-09 NOTE — Patient Instructions (Signed)
Health Maintenance, Female Adopting a healthy lifestyle and getting preventive care are important in promoting health and wellness. Ask your health care provider about: The right schedule for you to have regular tests and exams. Things you can do on your own to prevent diseases and keep yourself healthy. What should I know about diet, weight, and exercise? Eat a healthy diet  Eat a diet that includes plenty of vegetables, fruits, low-fat dairy products, and lean protein. Do not eat a lot of foods that are high in solid fats, added sugars, or sodium.  Maintain a healthy weight Body mass index (BMI) is used to identify weight problems. It estimates body fat based on height and weight. Your health care provider can help determineyour BMI and help you achieve or maintain a healthy weight. Get regular exercise Get regular exercise. This is one of the most important things you can do for your health. Most adults should: Exercise for at least 150 minutes each week. The exercise should increase your heart rate and make you sweat (moderate-intensity exercise). Do strengthening exercises at least twice a week. This is in addition to the moderate-intensity exercise. Spend less time sitting. Even light physical activity can be beneficial. Watch cholesterol and blood lipids Have your blood tested for lipids and cholesterol at 63 years of age, then havethis test every 5 years. Have your cholesterol levels checked more often if: Your lipid or cholesterol levels are high. You are older than 63 years of age. You are at high risk for heart disease. What should I know about cancer screening? Depending on your health history and family history, you may need to have cancer screening at various ages. This may include screening for: Breast cancer. Cervical cancer. Colorectal cancer. Skin cancer. Lung cancer. What should I know about heart disease, diabetes, and high blood pressure? Blood pressure and heart  disease High blood pressure causes heart disease and increases the risk of stroke. This is more likely to develop in people who have high blood pressure readings, are of African descent, or are overweight. Have your blood pressure checked: Every 3-5 years if you are 18-39 years of age. Every year if you are 40 years old or older. Diabetes Have regular diabetes screenings. This checks your fasting blood sugar level. Have the screening done: Once every three years after age 40 if you are at a normal weight and have a low risk for diabetes. More often and at a younger age if you are overweight or have a high risk for diabetes. What should I know about preventing infection? Hepatitis B If you have a higher risk for hepatitis B, you should be screened for this virus. Talk with your health care provider to find out if you are at risk forhepatitis B infection. Hepatitis C Testing is recommended for: Everyone born from 1945 through 1965. Anyone with known risk factors for hepatitis C. Sexually transmitted infections (STIs) Get screened for STIs, including gonorrhea and chlamydia, if: You are sexually active and are younger than 63 years of age. You are older than 63 years of age and your health care provider tells you that you are at risk for this type of infection. Your sexual activity has changed since you were last screened, and you are at increased risk for chlamydia or gonorrhea. Ask your health care provider if you are at risk. Ask your health care provider about whether you are at high risk for HIV. Your health care provider may recommend a prescription medicine to help   prevent HIV infection. If you choose to take medicine to prevent HIV, you should first get tested for HIV. You should then be tested every 3 months for as long as you are taking the medicine. Pregnancy If you are about to stop having your period (premenopausal) and you may become pregnant, seek counseling before you get  pregnant. Take 400 to 800 micrograms (mcg) of folic acid every day if you become pregnant. Ask for birth control (contraception) if you want to prevent pregnancy. Osteoporosis and menopause Osteoporosis is a disease in which the bones lose minerals and strength with aging. This can result in bone fractures. If you are 25 years old or older, or if you are at risk for osteoporosis and fractures, ask your health care provider if you should: Be screened for bone loss. Take a calcium or vitamin D supplement to lower your risk of fractures. Be given hormone replacement therapy (HRT) to treat symptoms of menopause. Follow these instructions at home: Lifestyle Do not use any products that contain nicotine or tobacco, such as cigarettes, e-cigarettes, and chewing tobacco. If you need help quitting, ask your health care provider. Do not use street drugs. Do not share needles. Ask your health care provider for help if you need support or information about quitting drugs. Alcohol use Do not drink alcohol if: Your health care provider tells you not to drink. You are pregnant, may be pregnant, or are planning to become pregnant. If you drink alcohol: Limit how much you use to 0-1 drink a day. Limit intake if you are breastfeeding. Be aware of how much alcohol is in your drink. In the [place] one drink equals one 12 oz bottle of beer (355 mL), one 5 oz glass of wine (148 mL), or one 1 oz glass of hard liquor (44 mL). General instructions Schedule regular health, dental, and eye exams. Stay current with your vaccines. Tell your health care provider if: You often feel depressed. You have ever been abused or do not feel safe at home. Summary Adopting a healthy lifestyle and getting preventive care are important in promoting health and wellness. Follow your health care provider's instructions about healthy diet, exercising, and getting tested or screened for diseases. Follow your health care provider's  instructions on monitoring your cholesterol and blood pressure. This information is not intended to replace advice given to you by your health care provider. Make sure you discuss any questions you have with your healthcare provider. Document Revised: 08/22/2018 Document Reviewed: 08/22/2018 Elsevier Patient Education  2022 [place] and Cholesterol Restricted Eating Plan Getting too much fat and cholesterol in your diet may cause health problems. Choosing the right foods helps keep your fat and cholesterol at normal levels.This can keep you from getting certain diseases. Your doctor may recommend an eating plan that includes: Total fat: ______% or less of total calories a day. Saturated fat: ______% or less of total calories a day. Cholesterol: less than _________mg a day. Fiber: ______g a day. What are tips for following this plan? Meal planning At meals, divide your plate into four equal parts: Fill one-half of your plate with vegetables and green salads. Fill one-fourth of your plate with whole grains. Fill one-fourth of your plate with low-fat (lean) protein foods. Eat fish that is high in omega-3 fats at least two times a week. This includes mackerel, tuna, sardines, and salmon. Eat foods that are high in fiber, such as whole grains, beans, apples, broccoli, carrots, peas, and barley. General  tips  Work with your doctor to lose weight if you need to. Avoid: Foods with added sugar. Fried foods. Foods with partially hydrogenated oils. Limit alcohol intake to no more than 1 drink a day for nonpregnant women and 2 drinks a day for men. One drink equals 12 oz of beer, 5 oz of wine, or 1 oz of hard liquor.  Reading food labels Check food labels for: Trans fats. Partially hydrogenated oils. Saturated fat (g) in each serving. Cholesterol (mg) in each serving. Fiber (g) in each serving. Choose foods with healthy fats, such as: Monounsaturated fats. Polyunsaturated  fats. Omega-3 fats. Choose grain products that have whole grains. Look for the word "whole" as the first word in the ingredient list. Cooking Cook foods using low-fat methods. These include baking, boiling, grilling, and broiling. Eat more home-cooked foods. Eat at restaurants and buffets less often. Avoid cooking using saturated fats, such as butter, cream, palm oil, palm kernel oil, and coconut oil. Recommended foods  Fruits All fresh, canned (in natural juice), or frozen fruits. Vegetables Fresh or frozen vegetables (raw, steamed, roasted, or grilled). Green salads. Grains Whole grains, such as whole wheat or whole grain breads, crackers, cereals, and pasta. Unsweetened oatmeal, bulgur, barley, quinoa, or brown rice. Corn or whole wheat flour tortillas. Meats and other protein foods Ground beef (85% or leaner), grass-fed beef, or beef trimmed of fat. Skinless chicken or [place]. Ground chicken or [place]. Pork trimmed of fat. All fish and seafood. Egg whites. Dried beans, peas, or lentils. Unsalted nuts or seeds. Unsalted canned beans. Nut butters without added sugar or oil. Dairy Low-fat or nonfat dairy products, such as skim or 1% milk, 2% or reduced-fat cheeses, low-fat and fat-free ricotta or cottage cheese, or plain low-fat and nonfat yogurt. Fats and oils Tub margarine without trans fats. Light or reduced-fat mayonnaise and salad dressings. Avocado. Olive, canola, sesame, or safflower oils. The items listed above may not be a complete list of foods and beverages youcan eat. Contact a dietitian for more information. Foods to avoid Fruits Canned fruit in heavy syrup. Fruit in cream or butter sauce. Fried fruit. Vegetables Vegetables cooked in cheese, cream, or butter sauce. Fried vegetables. Grains White bread. White pasta. White rice. Cornbread. Bagels, pastries, and croissants. Crackers and snack foods that contain trans fat and hydrogenated oils. Meats and other protein  foods Fatty cuts of meat. Ribs, chicken wings, bacon, sausage, bologna, salami, chitterlings, fatback, hot dogs, bratwurst, and packaged lunch meats. Liver and organ meats. Whole eggs and egg yolks. Chicken and [place] with skin. Fried meat. Dairy Whole or 2% milk, cream, half-and-half, and cream cheese. Whole milk cheeses. Whole-fat or sweetened yogurt. Full-fat cheeses. Nondairy creamers and whipped toppings. Processed cheese, cheese spreads, and cheese curds. Beverages Alcohol. Sugar-sweetened drinks such as sodas, lemonade, and fruit drinks. Fats and oils Butter, stick margarine, lard, shortening, ghee, or bacon fat. Coconut, palm kernel, and palm oils. Sweets and desserts Corn syrup, sugars, honey, and molasses. Candy. Jam and jelly. Syrup. Sweetened cereals. Cookies, pies, cakes, donuts, muffins, and ice cream. The items listed above may not be a complete list of foods and beverages youshould avoid. Contact a dietitian for more information. Summary Choosing the right foods helps keep your fat and cholesterol at normal levels. This can keep you from getting certain diseases. At meals, fill one-half of your plate with vegetables and green salads. Eat high-fiber foods, like whole grains, beans, apples, carrots, peas, and barley. Limit added sugar, saturated fats, alcohol, and  fried foods. This information is not intended to replace advice given to you by your health care provider. Make sure you discuss any questions you have with your healthcare provider. Document Revised: 01/01/2020 Document Reviewed: 01/01/2020 Elsevier Patient Education  2022 [employer].

## 2021-03-09 NOTE — Progress Notes (Signed)
Established Patient Office Visit  Subjective:  Patient ID: Teresa Sharp, female    DOB: [birth year]  Age: 65 y.o. MRN: [id number]  CC:  Chief Complaint  Patient presents with  . Annual Exam    HPI Teresa Sharp presents for complete physical exam.  Patient reports being at the beach last week and feeling like she had water getting trapped in her ear.  Denies any pain, no sneezing coughing or congestion.  She had a colonoscopy 9 years ago and is due to have a repeat colonoscopy in 2023.  Had a mammogram performed in May 2022.  She plans to schedule an appointment with gynecology for female well care.  She is not exercising as she once was routinely but plans to start.    Past Medical History:  Diagnosis Date  . Insomnia    no meds taken  . PONV (postoperative nausea and vomiting)     Past Surgical History:  Procedure Laterality Date  . ANTERIOR CERVICAL DECOMP/DISCECTOMY FUSION N/A 02/19/2013   Procedure: ANTERIOR CERVICAL DECOMPRESSION/DISCECTOMY FUSION 1 LEVEL C6-7 ;  Surgeon: Erline Levine, MD;  Location: [facility];  Service: Neurosurgery;  Laterality: N/A;  Cervical six-seven  Anterior cervical decompression and fusion  . CESAREAN SECTION  1996  . COLONOSCOPY    . TONSILLECTOMY AND ADENOIDECTOMY  1965    Family History  Problem Relation Age of Onset  . Diabetes Mother   . Hyperlipidemia Mother   . Hypertension Father   . Heart disease Father   . Peripheral vascular disease Father   . Hyperlipidemia Father   . Diabetes Maternal Grandmother   . Breast cancer Maternal Grandmother   . Heart disease Maternal Grandfather   . Healthy Brother   . Healthy Daughter   . Healthy Brother   . Healthy Brother   . Healthy Brother   . Healthy Brother   . Healthy Daughter     Social History   Socioeconomic History  . Marital status: Married    Spouse name: Kathyrn Sheriff  . Number of children: 2  . Years of education: Not on file  . Highest education level: Not on file   Occupational History  . Occupation: Actor: [employer]  Tobacco Use  . Smoking status: Never  . Smokeless tobacco: Never  Vaping Use  . Vaping Use: Never used  Substance and Sexual Activity  . Alcohol use: Yes    Comment: OCCASIONAL ONLY  . Drug use: No  . Sexual activity: Yes  Other Topics Concern  . Not on file  Social History Narrative  . Not on file   Social Determinants of Health   Financial Resource Strain: Not on file  Food Insecurity: Not on file  Transportation Needs: Not on file  Physical Activity: Not on file  Stress: Not on file  Social Connections: Not on file  Intimate Partner Violence: Not on file    No outpatient medications prior to visit.   No facility-administered medications prior to visit.    No Known Allergies  ROS Review of Systems  Constitutional: Negative.   HENT:  Negative for congestion, facial swelling and postnasal drip.        Ears feeling full  Eyes: Negative.   Respiratory: Negative.    Cardiovascular: Negative.   Gastrointestinal: Negative.   Endocrine: Negative.   Genitourinary: Negative.   Musculoskeletal: Negative.   Skin: Negative.   Allergic/Immunologic: Negative.   Neurological: Negative.   Psychiatric/Behavioral: Negative.  All other systems reviewed and are negative.    Objective:    Physical Exam Vitals and nursing note reviewed.  Constitutional:      Appearance: Normal appearance.  HENT:     Head: Normocephalic and atraumatic.     Right Ear: There is impacted cerumen.     Left Ear: Tympanic membrane, ear canal and external ear normal.     Ears:     Comments:  Informed consent was obtained and peroxide gel was inserted into the right ear using the lavage kit the ears were lavaged until clean.Inspection with a cerumen spoon removed residual wax. Patient tolerated the procedure well.     Nose: Nose normal.     Mouth/Throat:     Mouth: Mucous membranes are moist.  Eyes:      Extraocular Movements: Extraocular movements intact.     Pupils: Pupils are equal, round, and reactive to light.  Cardiovascular:     Rate and Rhythm: Normal rate and regular rhythm.     Pulses: Normal pulses.     Heart sounds: Normal heart sounds.  Pulmonary:     Effort: Pulmonary effort is normal.     Breath sounds: Normal breath sounds.  Abdominal:     General: Abdomen is flat.     Palpations: Abdomen is soft.  Genitourinary:    Comments: Deferred to GYN Musculoskeletal:        General: Normal range of motion.     Cervical back: Normal range of motion and neck supple.  Skin:    General: Skin is warm and dry.  Neurological:     General: No focal deficit present.     Mental Status: She is alert and oriented to person, place, and time.  Psychiatric:        Mood and Affect: Mood normal.        Behavior: Behavior normal.   BP 100/60   Pulse (!) 58   Temp (!) 97.4 F (36.3 C) (Temporal)   Ht 5' 3"  (1.6 m)   Wt 155 lb 9.6 oz (70.6 kg)   LMP 02/10/2013   SpO2 98%   BMI 27.56 kg/m  Wt Readings from Last 3 Encounters:  03/09/21 155 lb 9.6 oz (70.6 kg)  06/11/19 158 lb (71.7 kg)  08/03/18 149 lb (67.6 kg)     Health Maintenance Due  Topic Date Due  . Pneumococcal Vaccine 51-3 Years old (1 - PCV) Never done  . Zoster Vaccines- Shingrix (1 of 2) Never done    There are no preventive care reminders to display for this patient.  Lab Results  Component Value Date   TSH 1.22 06/11/2019   Lab Results  Component Value Date   WBC 6.1 06/11/2019   HGB 12.8 06/11/2019   HCT 38.3 06/11/2019   MCV 85.6 06/11/2019   PLT 247.0 06/11/2019   Lab Results  Component Value Date   NA 140 06/11/2019   K 4.2 06/11/2019   CO2 24 06/11/2019   GLUCOSE 91 06/11/2019   BUN 13 06/11/2019   CREATININE 0.80 06/11/2019   BILITOT 0.4 06/11/2019   ALKPHOS 67 06/11/2019   AST 14 06/11/2019   ALT 11 06/11/2019   PROT 6.4 06/11/2019   ALBUMIN 4.1 06/11/2019   CALCIUM 9.2 06/11/2019    GFR 72.90 06/11/2019   Lab Results  Component Value Date   CHOL 211 (H) 06/11/2019   Lab Results  Component Value Date   HDL 53.10 06/11/2019   Lab Results  Component  Value Date   LDLCALC 131 (H) 06/11/2019   Lab Results  Component Value Date   TRIG 133.0 06/11/2019   Lab Results  Component Value Date   CHOLHDL 4 06/11/2019   No results found for: HGBA1C    Assessment & Plan:   Problem List Items Addressed This Visit   None Visit Diagnoses     Routine general medical examination at a health care facility    -  Primary   Relevant Orders   CBC with Differential   Comprehensive metabolic panel   Lipid panel   TSH   Ambulatory referral to Gynecology   Impacted cerumen of right ear          Encouraged routine exercise.  Follow-up with gynecology.  Labs obtained today will notify patient pending results.  Recheck in 1 year and sooner as needed   Follow-up: Return in 1 year (on 03/09/2022).    Kennyth Arnold, FNP

## 2021-03-10 ENCOUNTER — Other Ambulatory Visit: Payer: Self-pay | Admitting: Family

## 2021-03-10 ENCOUNTER — Other Ambulatory Visit (HOSPITAL_COMMUNITY): Payer: Self-pay

## 2021-03-10 DIAGNOSIS — E7800 Pure hypercholesterolemia, unspecified: Secondary | ICD-10-CM

## 2021-03-10 LAB — COMPREHENSIVE METABOLIC PANEL
ALT: 16 U/L (ref 0–35)
AST: 20 U/L (ref 0–37)
Albumin: 4.3 g/dL (ref 3.5–5.2)
Alkaline Phosphatase: 61 U/L (ref 39–117)
BUN: 21 mg/dL (ref 6–23)
CO2: 25 mEq/L (ref 19–32)
Calcium: 9.3 mg/dL (ref 8.4–10.5)
Chloride: 106 mEq/L (ref 96–112)
Creatinine, Ser: 0.91 mg/dL (ref 0.40–1.20)
GFR: 67.45 mL/min (ref >60.00)
Glucose, Bld: 89 mg/dL (ref 70–99)
Potassium: 3.9 mEq/L (ref 3.5–5.1)
Sodium: 140 mEq/L (ref 135–145)
Total Bilirubin: 0.4 mg/dL (ref 0.2–1.2)
Total Protein: 6.9 g/dL (ref 6.0–8.3)

## 2021-03-10 LAB — CBC WITH DIFFERENTIAL/PLATELET
Basophils Absolute: 0 10*3/uL (ref 0.0–0.1)
Basophils Relative: 0.5 % (ref 0.0–3.0)
Eosinophils Absolute: 0.2 10*3/uL (ref 0.0–0.7)
Eosinophils Relative: 1.7 % (ref 0.0–5.0)
HCT: 37.3 % (ref 36.0–46.0)
Hemoglobin: 12.5 g/dL (ref 12.0–15.0)
Lymphocytes Relative: 24.5 % (ref 12.0–46.0)
Lymphs Abs: 2.3 10*3/uL (ref 0.7–4.0)
MCHC: 33.4 g/dL (ref 30.0–36.0)
MCV: 85.6 fl (ref 78.0–100.0)
Monocytes Absolute: 0.5 10*3/uL (ref 0.1–1.0)
Monocytes Relative: 5.4 % (ref 3.0–12.0)
Neutro Abs: 6.3 10*3/uL (ref 1.4–7.7)
Neutrophils Relative %: 67.9 % (ref 43.0–77.0)
Platelets: 271 10*3/uL (ref 150.0–400.0)
RBC: 4.36 Mil/uL (ref 3.87–5.11)
RDW: 13.4 % (ref 11.5–15.5)
WBC: 9.3 10*3/uL (ref 4.0–10.5)

## 2021-03-10 LAB — LIPID PANEL
Cholesterol: 237 mg/dL — ABNORMAL HIGH (ref 0–200)
HDL: 52 mg/dL (ref >39.00)
LDL Cholesterol: 151 mg/dL — ABNORMAL HIGH (ref 0–99)
NonHDL: 185.48
Total CHOL/HDL Ratio: 5
Triglycerides: 170 mg/dL — ABNORMAL HIGH (ref 0.0–149.0)
VLDL: 34 mg/dL (ref 0.0–40.0)

## 2021-03-10 LAB — TSH: TSH: 1.13 u[IU]/mL (ref 0.35–4.50)

## 2021-03-10 MED ORDER — SIMVASTATIN 20 MG PO TABS
20.0000 mg | ORAL_TABLET | Freq: Every day | ORAL | 3 refills | Status: DC
  Filled 2021-03-10: qty 30, 30d supply, fill #0
  Filled 2021-03-18: qty 90, 90d supply, fill #0
  Filled 2021-06-28: qty 90, 90d supply, fill #1

## 2021-03-18 ENCOUNTER — Other Ambulatory Visit (HOSPITAL_COMMUNITY): Payer: Self-pay

## 2021-05-20 ENCOUNTER — Encounter: Payer: 59 | Admitting: Family Medicine

## 2021-06-28 ENCOUNTER — Other Ambulatory Visit (HOSPITAL_COMMUNITY): Payer: Self-pay

## 2021-06-28 ENCOUNTER — Other Ambulatory Visit: Payer: Self-pay | Admitting: Family

## 2021-06-28 MED ORDER — SIMVASTATIN 20 MG PO TABS
20.0000 mg | ORAL_TABLET | Freq: Every day | ORAL | 1 refills | Status: DC
  Filled 2021-06-28: qty 90, 90d supply, fill #0
  Filled 2021-10-12: qty 90, 90d supply, fill #1

## 2021-06-29 ENCOUNTER — Other Ambulatory Visit (HOSPITAL_COMMUNITY): Payer: Self-pay

## 2021-10-12 ENCOUNTER — Other Ambulatory Visit (HOSPITAL_COMMUNITY): Payer: Self-pay

## 2022-01-14 ENCOUNTER — Other Ambulatory Visit: Payer: Self-pay | Admitting: Family Medicine

## 2022-01-14 DIAGNOSIS — Z1231 Encounter for screening mammogram for malignant neoplasm of breast: Secondary | ICD-10-CM

## 2022-01-24 ENCOUNTER — Other Ambulatory Visit: Payer: Self-pay | Admitting: Family

## 2022-01-25 ENCOUNTER — Other Ambulatory Visit (HOSPITAL_COMMUNITY): Payer: Self-pay

## 2022-01-31 ENCOUNTER — Ambulatory Visit: Payer: 59

## 2022-02-08 ENCOUNTER — Ambulatory Visit
Admission: RE | Admit: 2022-02-08 | Discharge: 2022-02-08 | Disposition: A | Discharge status: Home / Self Care | Payer: 59 | Source: Ambulatory Visit | Attending: Family Medicine | Admitting: Family Medicine

## 2022-02-08 DIAGNOSIS — Z1231 Encounter for screening mammogram for malignant neoplasm of breast: Secondary | ICD-10-CM | POA: Diagnosis not present

## 2022-03-31 ENCOUNTER — Encounter: Payer: Self-pay | Admitting: Family Medicine

## 2022-03-31 DIAGNOSIS — D2262 Melanocytic nevi of left upper limb, including shoulder: Secondary | ICD-10-CM | POA: Diagnosis not present

## 2022-03-31 DIAGNOSIS — L821 Other seborrheic keratosis: Secondary | ICD-10-CM | POA: Diagnosis not present

## 2022-03-31 DIAGNOSIS — L570 Actinic keratosis: Secondary | ICD-10-CM | POA: Diagnosis not present

## 2022-03-31 DIAGNOSIS — B078 Other viral warts: Secondary | ICD-10-CM | POA: Diagnosis not present

## 2022-03-31 DIAGNOSIS — D485 Neoplasm of uncertain behavior of skin: Secondary | ICD-10-CM | POA: Diagnosis not present

## 2022-03-31 DIAGNOSIS — C441192 Basal cell carcinoma of skin of left lower eyelid, including canthus: Secondary | ICD-10-CM | POA: Diagnosis not present

## 2022-03-31 DIAGNOSIS — C44319 Basal cell carcinoma of skin of other parts of face: Secondary | ICD-10-CM | POA: Diagnosis not present

## 2022-03-31 DIAGNOSIS — L720 Epidermal cyst: Secondary | ICD-10-CM | POA: Diagnosis not present

## 2022-04-18 ENCOUNTER — Encounter: Payer: Self-pay | Admitting: Internal Medicine

## 2022-04-18 ENCOUNTER — Ambulatory Visit: Payer: 59 | Admitting: Internal Medicine

## 2022-04-18 VITALS — BP 128/74 | HR 63 | Temp 98.2°F | Resp 16 | Ht 63.0 in | Wt 143.4 lb

## 2022-04-18 DIAGNOSIS — E785 Hyperlipidemia, unspecified: Secondary | ICD-10-CM

## 2022-04-18 DIAGNOSIS — Z1211 Encounter for screening for malignant neoplasm of colon: Secondary | ICD-10-CM

## 2022-04-18 DIAGNOSIS — Z0000 Encounter for general adult medical examination without abnormal findings: Secondary | ICD-10-CM | POA: Diagnosis not present

## 2022-04-18 DIAGNOSIS — Z23 Encounter for immunization: Secondary | ICD-10-CM

## 2022-04-18 NOTE — Progress Notes (Signed)
Subjective:  Patient ID: Teresa Sharp, female    DOB: Feb 21, 1956  Age: 66 y.o. MRN: [id number]  CC: Annual Exam and Hyperlipidemia   HPI Teresa Sharp presents for a CPX and to establish -  She is a very active walker/runner and does not experience chest pain, shortness of breath, diaphoresis, or edema.  She would like to stop taking the statin.  Outpatient Medications Prior to Visit  Medication Sig Dispense Refill   simvastatin (ZOCOR) 20 MG tablet Take 1 tablet (20 mg total) by mouth at bedtime. 90 tablet 1   No facility-administered medications prior to visit.    ROS Review of Systems  Constitutional:  Negative for appetite change, diaphoresis and fatigue.  HENT: Negative.    Eyes: Negative.   Respiratory:  Negative for cough, chest tightness, shortness of breath and wheezing.   Cardiovascular:  Negative for chest pain, palpitations and leg swelling.  Gastrointestinal:  Negative for abdominal pain, blood in stool, constipation, diarrhea, nausea and vomiting.  Endocrine: Negative.   Genitourinary: Negative.  Negative for difficulty urinating.  Musculoskeletal:  Negative for arthralgias and myalgias.  Skin: Negative.   Neurological: Negative.  Negative for dizziness and weakness.  Hematological:  Negative for adenopathy. Does not bruise/bleed easily.  Psychiatric/Behavioral: Negative.      Objective:  BP 128/74 (BP Location: Right Arm, Patient Position: Sitting, Cuff Size: Large)   Pulse 63   Temp 98.2 F (36.8 C) (Oral)   Resp 16   Ht '5\' 3"'$  (1.6 m)   Wt 143 lb 6 oz (65 kg)   LMP 02/10/2013   SpO2 97%   BMI 25.40 kg/m   BP Readings from Last 3 Encounters:  04/18/22 128/74  03/09/21 100/60  06/11/19 104/68    Wt Readings from Last 3 Encounters:  04/18/22 143 lb 6 oz (65 kg)  03/09/21 155 lb 9.6 oz (70.6 kg)  06/11/19 158 lb (71.7 kg)    Physical Exam Vitals reviewed.  HENT:     Nose: Nose normal.     Mouth/Throat:     Mouth: Mucous membranes are  moist.  Eyes:     General: No scleral icterus.    Conjunctiva/sclera: Conjunctivae normal.  Cardiovascular:     Rate and Rhythm: Normal rate and regular rhythm.     Heart sounds: No murmur heard. Pulmonary:     Effort: Pulmonary effort is normal.     Breath sounds: No stridor. No wheezing, rhonchi or rales.  Abdominal:     General: Abdomen is flat.     Palpations: There is no mass.     Tenderness: There is no abdominal tenderness. There is no guarding.     Hernia: No hernia is present.  Musculoskeletal:        General: Normal range of motion.     Cervical back: Neck supple.     Right lower leg: No edema.     Left lower leg: No edema.  Lymphadenopathy:     Cervical: No cervical adenopathy.  Skin:    General: Skin is warm and dry.  Neurological:     General: No focal deficit present.     Mental Status: She is alert.  Psychiatric:        Mood and Affect: Mood normal.        Behavior: Behavior normal.     Lab Results  Component Value Date   WBC 8.3 04/18/2022   HGB 12.8 04/18/2022   HCT 38.0 04/18/2022   PLT 236.0  04/18/2022   GLUCOSE 100 (H) 04/18/2022   CHOL 165 04/18/2022   TRIG 240.0 (H) 04/18/2022   HDL 55.60 04/18/2022   LDLDIRECT 82.0 04/18/2022   LDLCALC 151 (H) 03/09/2021   ALT 18 04/18/2022   AST 17 04/18/2022   NA 142 04/18/2022   K 4.4 04/18/2022   CL 104 04/18/2022   CREATININE 0.71 04/18/2022   BUN 19 04/18/2022   CO2 27 04/18/2022   TSH 1.13 03/09/2021    MM 3D SCREEN BREAST BILATERAL  Result Date: 02/09/2022 CLINICAL DATA:  Screening. EXAM: DIGITAL SCREENING BILATERAL MAMMOGRAM WITH TOMOSYNTHESIS AND CAD TECHNIQUE: Bilateral screening digital craniocaudal and mediolateral oblique mammograms were obtained. Bilateral screening digital breast tomosynthesis was performed. The images were evaluated with computer-aided detection. COMPARISON:  Previous exam(s). ACR Breast Density Category b: There are scattered areas of fibroglandular density. FINDINGS:  There are no findings suspicious for malignancy. IMPRESSION: No mammographic evidence of malignancy. A result letter of this screening mammogram will be mailed directly to the patient. RECOMMENDATION: Screening mammogram in one year. (Code:SM-B-01Y) BI-RADS CATEGORY  1: Negative. Electronically Signed   By: Evangeline Dakin M.D.   On: 02/09/2022 15:55    Assessment & Plan:   Makari was seen today for annual exam and hyperlipidemia.  Diagnoses and all orders for this visit:  Need for vaccination -     Zoster Recombinant (Shingrix )  Annual physical exam- Exam completed, labs reviewed, vaccines reviewed and updated, cancer screenings addressed, patient education was given.  Hyperlipidemia with target LDL less than 130- She has a low ASCVD risk score and a reassuring family history.  I have given her permission to stop taking the statin. -     Lipid panel; Future -     Basic metabolic panel; Future -     CBC with Differential/Platelet; Future -     Hepatic function panel; Future -     Hepatic function panel -     CBC with Differential/Platelet -     Basic metabolic panel -     Lipid panel  Screen for colon cancer -     Cologuard  Other orders -     LDL cholesterol, direct   I have discontinued Jeani Hawking A. Wirthlin's simvastatin.  No orders of the defined types were placed in this encounter.    Follow-up: Return in about 6 months (around 10/19/2022).  Scarlette Calico, MD

## 2022-04-18 NOTE — Patient Instructions (Signed)

## 2022-04-19 LAB — CBC WITH DIFFERENTIAL/PLATELET
Basophils Absolute: 0.1 10*3/uL (ref 0.0–0.1)
Basophils Relative: 0.7 % (ref 0.0–3.0)
Eosinophils Absolute: 0.1 10*3/uL (ref 0.0–0.7)
Eosinophils Relative: 1.7 % (ref 0.0–5.0)
HCT: 38 % (ref 36.0–46.0)
Hemoglobin: 12.8 g/dL (ref 12.0–15.0)
Lymphocytes Relative: 34 % (ref 12.0–46.0)
Lymphs Abs: 2.8 10*3/uL (ref 0.7–4.0)
MCHC: 33.6 g/dL (ref 30.0–36.0)
MCV: 86 fl (ref 78.0–100.0)
Monocytes Absolute: 0.5 10*3/uL (ref 0.1–1.0)
Monocytes Relative: 5.5 % (ref 3.0–12.0)
Neutro Abs: 4.8 10*3/uL (ref 1.4–7.7)
Neutrophils Relative %: 58.1 % (ref 43.0–77.0)
Platelets: 236 10*3/uL (ref 150.0–400.0)
RBC: 4.42 Mil/uL (ref 3.87–5.11)
RDW: 13.2 % (ref 11.5–15.5)
WBC: 8.3 10*3/uL (ref 4.0–10.5)

## 2022-04-19 LAB — BASIC METABOLIC PANEL
BUN: 19 mg/dL (ref 6–23)
CO2: 27 mEq/L (ref 19–32)
Calcium: 9.3 mg/dL (ref 8.4–10.5)
Chloride: 104 mEq/L (ref 96–112)
Creatinine, Ser: 0.71 mg/dL (ref 0.40–1.20)
GFR: 90.14 mL/min (ref >60.00)
Glucose, Bld: 100 mg/dL — ABNORMAL HIGH (ref 70–99)
Potassium: 4.4 mEq/L (ref 3.5–5.1)
Sodium: 142 mEq/L (ref 135–145)

## 2022-04-19 LAB — LIPID PANEL
Cholesterol: 165 mg/dL (ref 0–200)
HDL: 55.6 mg/dL (ref >39.00)
NonHDL: 109.13
Total CHOL/HDL Ratio: 3
Triglycerides: 240 mg/dL — ABNORMAL HIGH (ref 0.0–149.0)
VLDL: 48 mg/dL — ABNORMAL HIGH (ref 0.0–40.0)

## 2022-04-19 LAB — HEPATIC FUNCTION PANEL
ALT: 18 U/L (ref 0–35)
AST: 17 U/L (ref 0–37)
Albumin: 4.4 g/dL (ref 3.5–5.2)
Alkaline Phosphatase: 63 U/L (ref 39–117)
Bilirubin, Direct: 0 mg/dL (ref 0.0–0.3)
Total Bilirubin: 0.6 mg/dL (ref 0.2–1.2)
Total Protein: 7.1 g/dL (ref 6.0–8.3)

## 2022-04-19 LAB — LDL CHOLESTEROL, DIRECT: Direct LDL: 82 mg/dL

## 2022-05-05 DIAGNOSIS — Z1211 Encounter for screening for malignant neoplasm of colon: Secondary | ICD-10-CM | POA: Diagnosis not present

## 2022-05-09 ENCOUNTER — Other Ambulatory Visit (HOSPITAL_COMMUNITY): Payer: Self-pay

## 2022-05-09 MED ORDER — METHYLPREDNISOLONE 4 MG PO TBPK
ORAL_TABLET | ORAL | 3 refills | Status: DC
  Filled 2022-05-09: qty 21, 6d supply, fill #0
  Filled 2022-05-18: qty 21, 6d supply, fill #1
  Filled 2022-06-22: qty 21, 6d supply, fill #2

## 2022-05-14 LAB — COLOGUARD: COLOGUARD: NEGATIVE

## 2022-05-18 ENCOUNTER — Other Ambulatory Visit (HOSPITAL_COMMUNITY): Payer: Self-pay

## 2022-05-30 ENCOUNTER — Encounter: Payer: Self-pay | Admitting: Gastroenterology

## 2022-06-07 ENCOUNTER — Other Ambulatory Visit (HOSPITAL_COMMUNITY): Payer: Self-pay

## 2022-06-07 DIAGNOSIS — Z85828 Personal history of other malignant neoplasm of skin: Secondary | ICD-10-CM | POA: Diagnosis not present

## 2022-06-07 DIAGNOSIS — C441191 Basal cell carcinoma of skin of left upper eyelid, including canthus: Secondary | ICD-10-CM | POA: Diagnosis not present

## 2022-06-07 MED ORDER — DOXYCYCLINE HYCLATE 100 MG PO CAPS
100.0000 mg | ORAL_CAPSULE | Freq: Two times a day (BID) | ORAL | 0 refills | Status: DC
  Filled 2022-06-07: qty 10, 5d supply, fill #0

## 2022-06-22 ENCOUNTER — Other Ambulatory Visit (HOSPITAL_COMMUNITY): Payer: Self-pay

## 2022-09-23 ENCOUNTER — Other Ambulatory Visit (HOSPITAL_COMMUNITY): Payer: Self-pay

## 2022-09-23 ENCOUNTER — Ambulatory Visit (INDEPENDENT_AMBULATORY_CARE_PROVIDER_SITE_OTHER): Payer: 59

## 2022-09-23 ENCOUNTER — Encounter: Payer: Self-pay | Admitting: Family Medicine

## 2022-09-23 ENCOUNTER — Ambulatory Visit (INDEPENDENT_AMBULATORY_CARE_PROVIDER_SITE_OTHER): Payer: 59 | Admitting: Family Medicine

## 2022-09-23 VITALS — BP 102/76 | HR 71 | Ht 63.0 in | Wt 148.0 lb

## 2022-09-23 DIAGNOSIS — M9902 Segmental and somatic dysfunction of thoracic region: Secondary | ICD-10-CM

## 2022-09-23 DIAGNOSIS — M545 Low back pain, unspecified: Secondary | ICD-10-CM

## 2022-09-23 DIAGNOSIS — M9904 Segmental and somatic dysfunction of sacral region: Secondary | ICD-10-CM

## 2022-09-23 DIAGNOSIS — M533 Sacrococcygeal disorders, not elsewhere classified: Secondary | ICD-10-CM

## 2022-09-23 DIAGNOSIS — M9903 Segmental and somatic dysfunction of lumbar region: Secondary | ICD-10-CM | POA: Diagnosis not present

## 2022-09-23 DIAGNOSIS — R102 Pelvic and perineal pain: Secondary | ICD-10-CM | POA: Diagnosis not present

## 2022-09-23 MED ORDER — COLCHICINE 0.6 MG PO TABS
0.6000 mg | ORAL_TABLET | Freq: Two times a day (BID) | ORAL | 0 refills | Status: AC
  Filled 2022-09-23: qty 10, 5d supply, fill #0

## 2022-09-23 NOTE — Assessment & Plan Note (Signed)
Patient does have some sacroiliac dysfunction.  He is concerned also secondary to the severity of pain over the osteitis pubis is with differential as well.  Discussed with patient about icing regimen and home exercises.  Discussed short course of colchicine.  Discussed which activities to do and watch clinically avoid.  Increase activity slowly.  Follow-up again in 6 to 8 weeks.

## 2022-09-23 NOTE — Patient Instructions (Signed)
Good to see you  Colchicine 2 times a day 0.6 Lumbar and pelvis x ray today before you leave SI joint exercises given See me in 5-6 weeks but send me a message in 5 days

## 2022-09-23 NOTE — Assessment & Plan Note (Signed)

## 2022-09-23 NOTE — Progress Notes (Signed)
[place] Phone: [phone] Subjective:   Teresa Sharp, am serving as a scribe for Dr. Hulan Saas.  I'm seeing this patient by the request  of:  Teresa Lima, MD  CC: Back and hip pain  [id number]  Teresa Sharp is a 67 y.o. female coming in with complaint of back and hip pain. Patient states that she is having R SI jt pain since September after doing a lot of hiking. Has tried prednisone. Pain improves with movement. Most painful to sleep at night.     Past medical history significant for a ACDF at C5-6    Past Medical History:  Diagnosis Date   Insomnia    Sharp meds taken   PONV (postoperative nausea and vomiting)    Past Surgical History:  Procedure Laterality Date   ANTERIOR CERVICAL DECOMP/DISCECTOMY FUSION N/A 02/19/2013   Procedure: ANTERIOR CERVICAL DECOMPRESSION/DISCECTOMY FUSION 1 LEVEL C6-7 ;  Surgeon: Erline Levine, MD;  Location: [facility];  Service: Neurosurgery;  Laterality: N/A;  Cervical six-seven  Anterior cervical decompression and fusion   CESAREAN SECTION  1996   COLONOSCOPY     TONSILLECTOMY AND ADENOIDECTOMY  1965   Social History   Socioeconomic History   Marital status: Married    Spouse name: Teresa Sharp   Number of children: 2   Years of education: Not on file   Highest education level: Not on file  Occupational History   Occupation: Actor: [employer]  Tobacco Use   Smoking status: Never   Smokeless tobacco: Never  Vaping Use   Vaping Use: Never used  Substance and Sexual Activity   Alcohol use: Yes    Comment: OCCASIONAL ONLY   Drug use: Sharp   Sexual activity: Yes  Other Topics Concern   Not on file  Social History Narrative   Not on file   Social Determinants of Health   Financial Resource Strain: Not on file  Food Insecurity: Not on file  Transportation Needs: Not on file  Physical Activity: Not on file  Stress:  Not on file  Social Connections: Not on file   Sharp Known Allergies Family History  Problem Relation Age of Onset   Diabetes Mother    Hyperlipidemia Mother    Hypertension Father    Heart disease Father    Peripheral vascular disease Father    Hyperlipidemia Father    Diabetes Maternal Grandmother    Breast cancer Maternal Grandmother    Heart disease Maternal Grandfather    Healthy Brother    Healthy Daughter    Healthy Brother    Healthy Brother    Healthy Brother    Healthy Brother    Healthy Daughter        Current Outpatient Medications (Analgesics):    colchicine 0.6 MG tablet, Take 1 tablet (0.6 mg total) by mouth 2 (two) times daily.     Reviewed prior external information including notes and imaging from  primary care provider As well as notes that were available from care everywhere and other healthcare systems.  Past medical history, social, surgical and family history all reviewed in electronic medical record.  Sharp pertanent information unless stated regarding to the chief complaint.   Review of Systems:  Sharp headache, visual changes, nausea, vomiting, diarrhea, constipation, dizziness, abdominal pain, skin rash, fevers, chills, night sweats, weight loss, swollen lymph nodes, body aches, joint swelling, chest pain, shortness  of breath, mood changes. POSITIVE muscle aches  Objective  Blood pressure 102/76, pulse 71, height '5\' 3"'$  (1.6 m), weight 148 lb (67.1 kg), last menstrual period 02/10/2013, SpO2 98 %.   General: Sharp apparent distress alert and oriented x3 mood and affect normal, dressed appropriately.  HEENT: Pupils equal, extraocular movements intact  Respiratory: Patient's speak in full sentences and does not appear short of breath  Cardiovascular: Sharp lower extremity edema, non tender, Sharp erythema  Overall back does have some loss of lordosis patient does have tenderness to palpation over the right sacroiliac joint.  Severe tightness with FABER test  bilaterally.  Patient actually lacks last 10 degrees of extension as well.  Patient does have negative straight leg test.    Osteopathic findings  T9 extended rotated and side bent left L1 flexed rotated and side bent right Sacrum right on right     Impression and Recommendations:     The above documentation has been reviewed and is accurate and complete Teresa Pulley, DO

## 2022-11-08 ENCOUNTER — Ambulatory Visit: Payer: 59 | Admitting: Family Medicine

## 2023-03-13 IMAGING — MG MM DIGITAL SCREENING BILAT W/ TOMO AND CAD
6 of 10 series · 6 of 30 positions shown · non-contrast
Comparison: Previous exam(s).

CLINICAL DATA: Screening.

EXAM:
DIGITAL SCREENING BILATERAL MAMMOGRAM WITH TOMOSYNTHESIS AND CAD
TECHNIQUE: Bilateral screening digital craniocaudal and mediolateral oblique
mammograms were obtained. Bilateral screening digital breast
tomosynthesis was performed. The images were evaluated with
computer-aided detection.

[R MLO synth-2D]
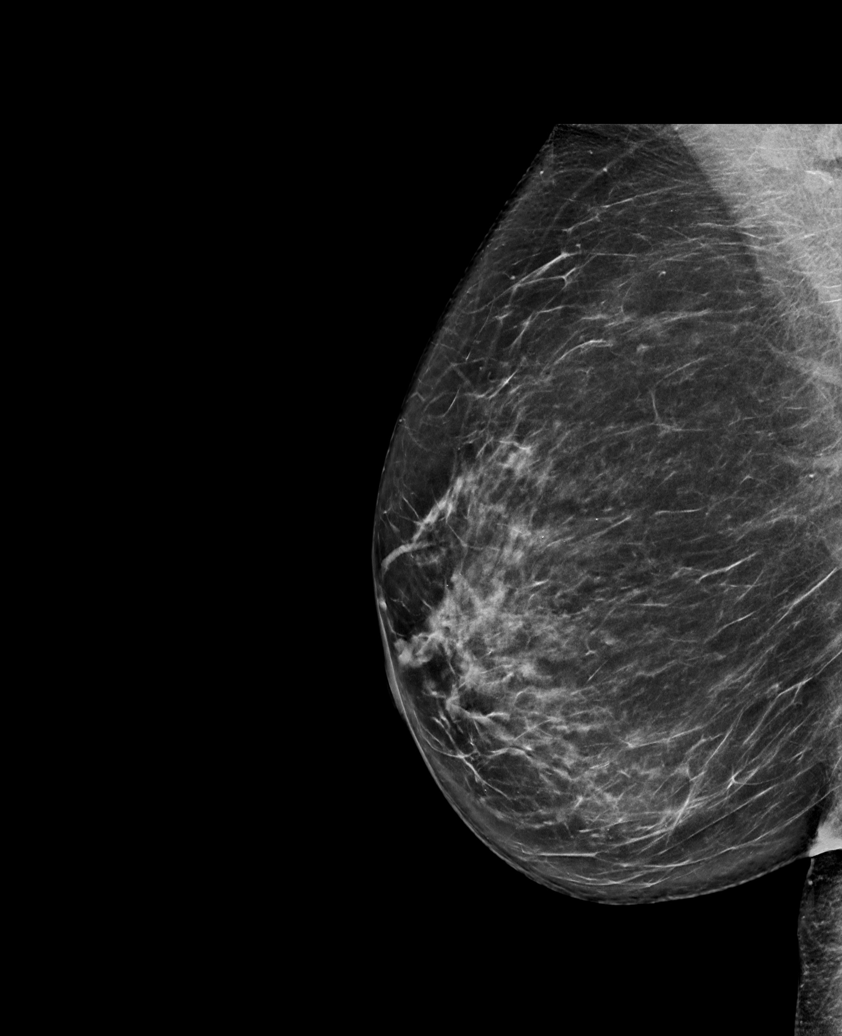

[L CC synth-2D]
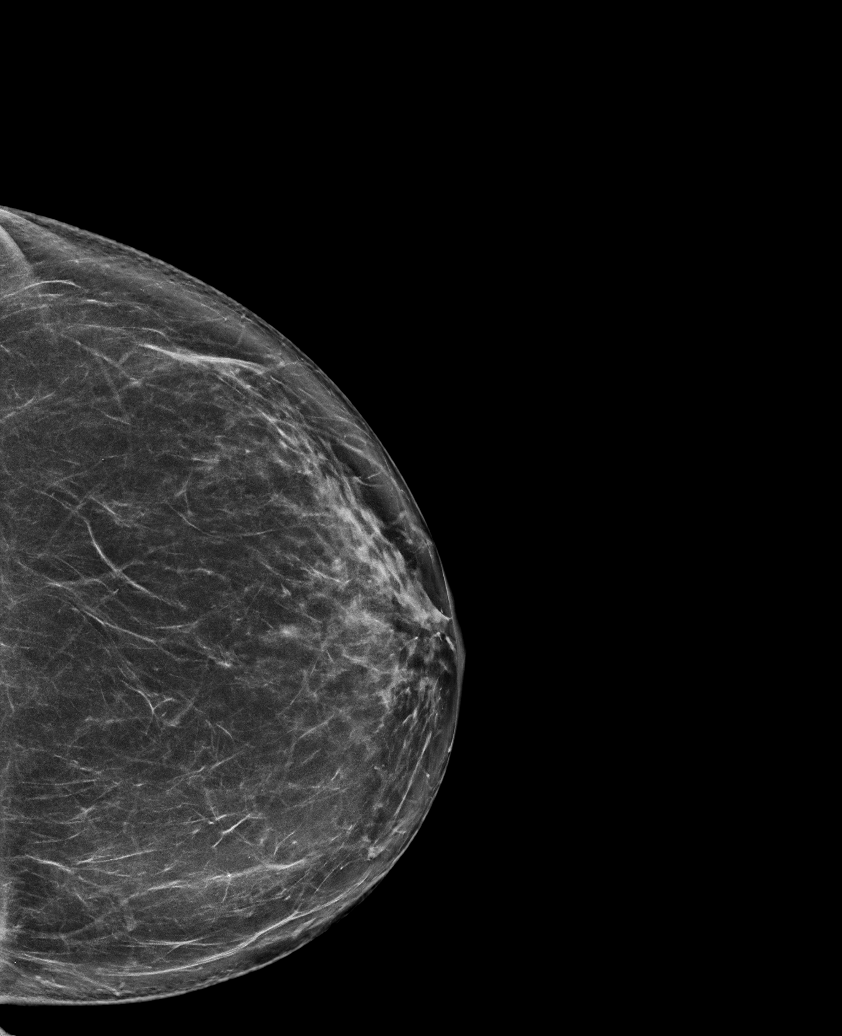

[R CC synth-2D]
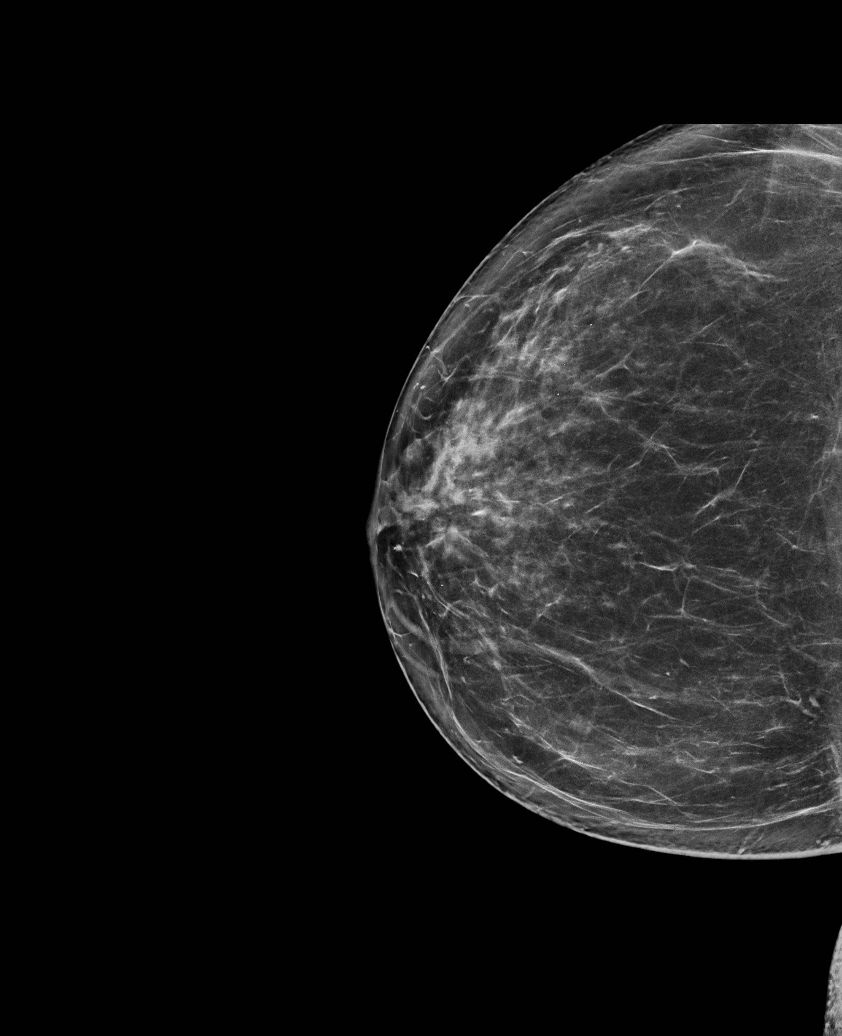

[L MLO synth-2D (1 of 2)]
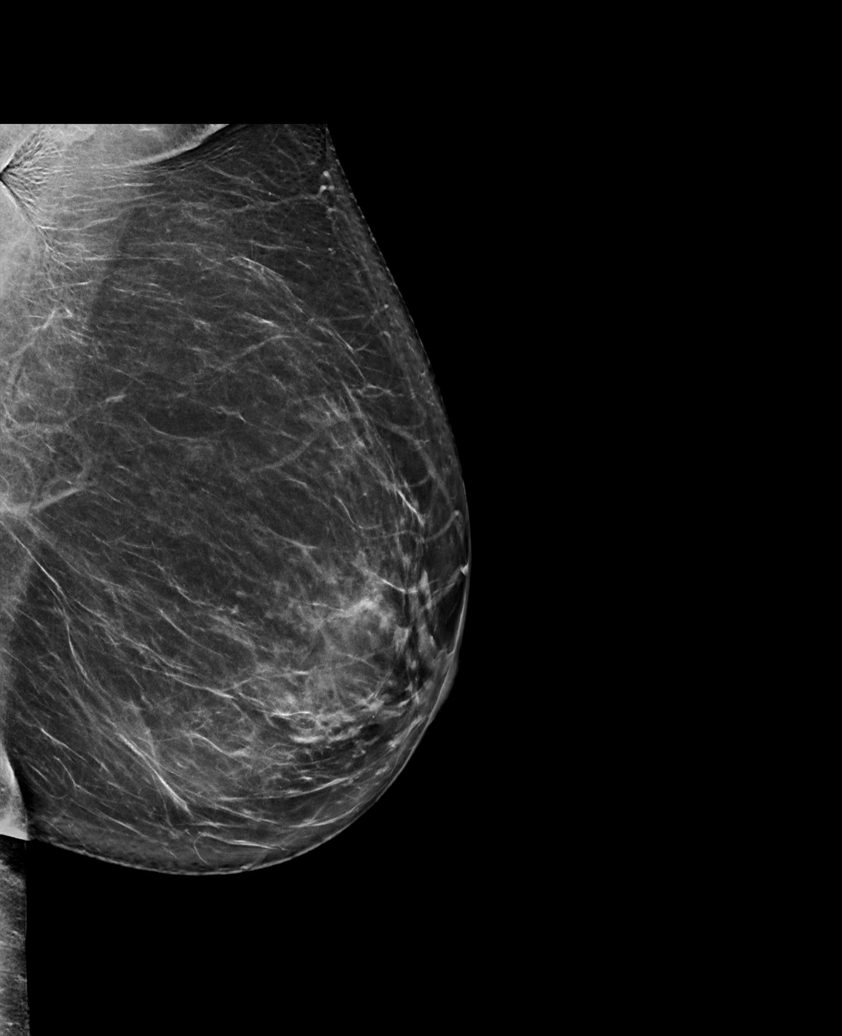

[L MLO synth-2D (2 of 2)]
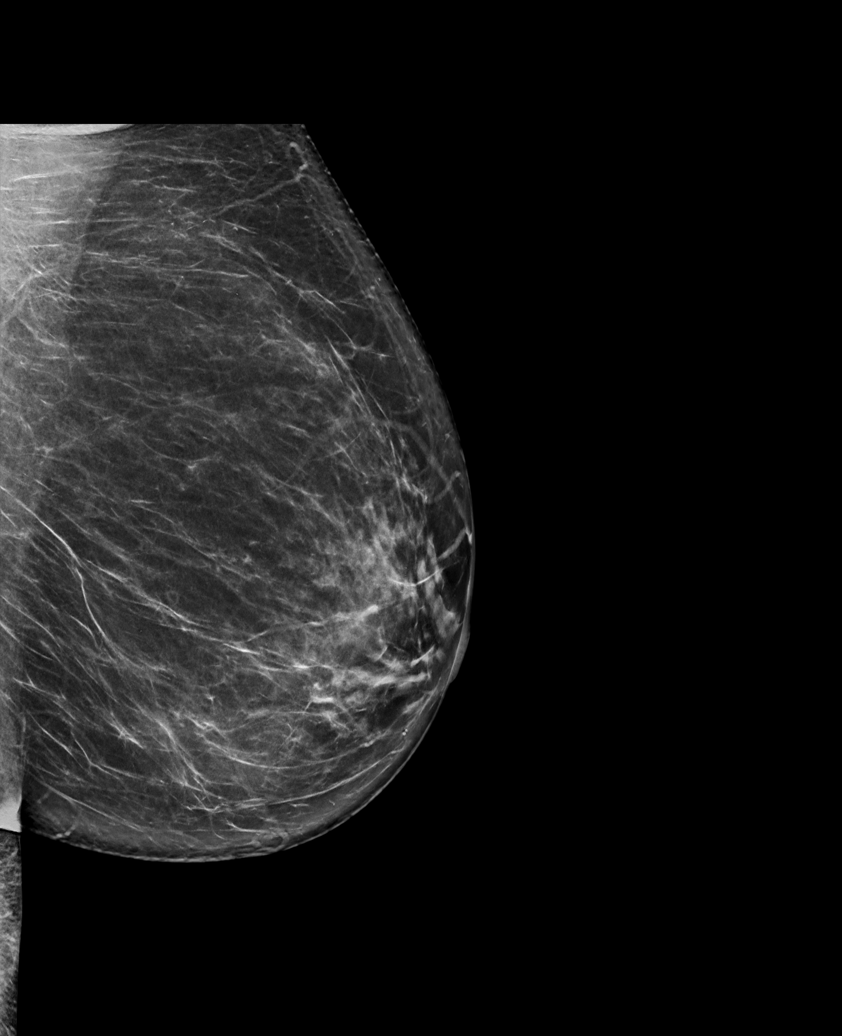

[R MLO tomo · tomo slice 43/84.0]
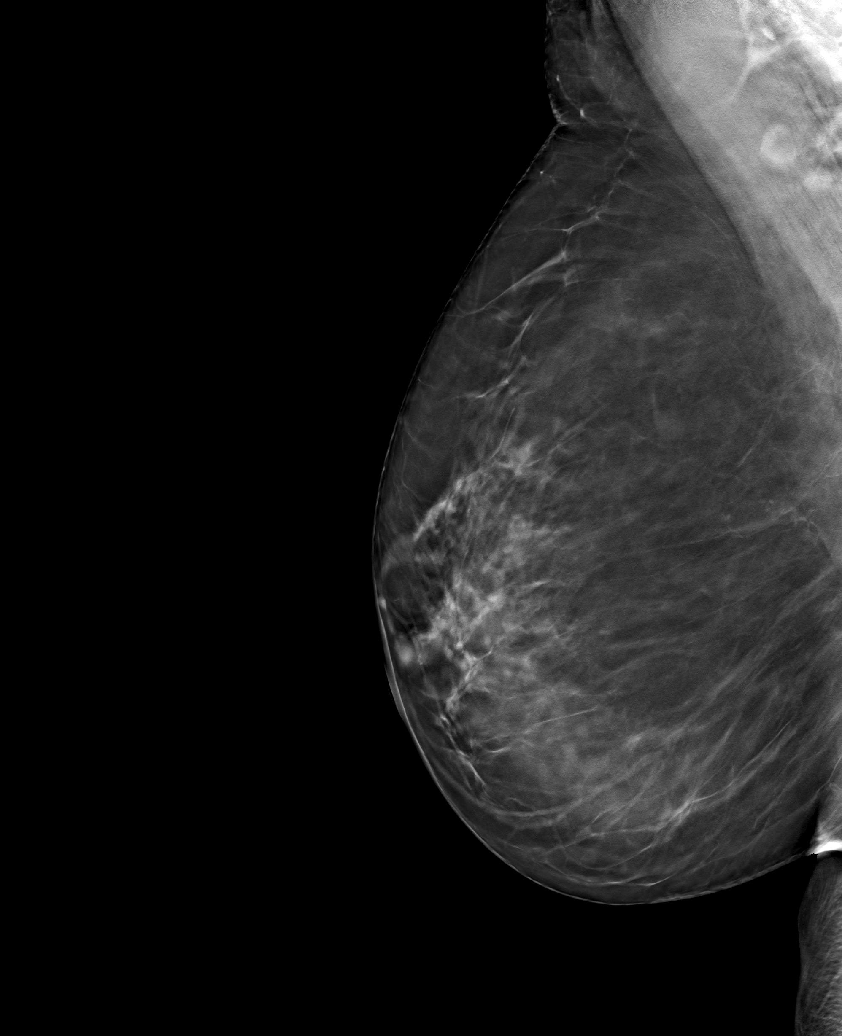

[6 of 30 positions shown; findings below may reference images not displayed]

ACR Breast Density Category b: There are scattered areas of
fibroglandular density.
FINDINGS: There are no findings suspicious for malignancy.
IMPRESSION: No mammographic evidence of malignancy. A result letter of this
screening mammogram will be mailed directly to the patient.

RECOMMENDATION:
Screening mammogram in one year. (Code:51-O-LD2)

BI-RADS CATEGORY  1: Negative.

## 2024-01-05 ENCOUNTER — Other Ambulatory Visit (HOSPITAL_BASED_OUTPATIENT_CLINIC_OR_DEPARTMENT_OTHER): Payer: Self-pay | Admitting: Internal Medicine

## 2024-01-05 DIAGNOSIS — Z1231 Encounter for screening mammogram for malignant neoplasm of breast: Secondary | ICD-10-CM

## 2024-01-08 ENCOUNTER — Ambulatory Visit (HOSPITAL_BASED_OUTPATIENT_CLINIC_OR_DEPARTMENT_OTHER)
Admission: RE | Admit: 2024-01-08 | Discharge: 2024-01-08 | Disposition: A | Discharge status: Home / Self Care | Source: Ambulatory Visit

## 2024-01-08 ENCOUNTER — Encounter (HOSPITAL_BASED_OUTPATIENT_CLINIC_OR_DEPARTMENT_OTHER): Payer: Self-pay | Admitting: Radiology

## 2024-01-08 DIAGNOSIS — Z1231 Encounter for screening mammogram for malignant neoplasm of breast: Secondary | ICD-10-CM | POA: Insufficient documentation
